# Patient Record
Sex: Female | Born: 1995 | Race: Black or African American | Hispanic: No | Marital: Single | Smoking: Current some day smoker
Health system: Southern US, Community
[De-identification: ages and names within clinical notes are randomized; demographics above are authoritative.]

## PROBLEM LIST (undated history)

## (undated) ENCOUNTER — Emergency Department: Admission: EM | Payer: Medicaid Other

## (undated) DIAGNOSIS — Z8759 Personal history of other complications of pregnancy, childbirth and the puerperium: Secondary | ICD-10-CM

## (undated) DIAGNOSIS — O020 Blighted ovum and nonhydatidiform mole: Secondary | ICD-10-CM

## (undated) DIAGNOSIS — F988 Other specified behavioral and emotional disorders with onset usually occurring in childhood and adolescence: Secondary | ICD-10-CM

## (undated) HISTORY — PX: NO PAST SURGERIES: SHX2092

---
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
## 2016-08-07 ENCOUNTER — Ambulatory Visit
Admission: EM | Admit: 2016-08-07 | Discharge: 2016-08-07 | Disposition: A | Discharge status: Home / Self Care | Payer: Medicaid Other | Attending: Emergency Medicine | Admitting: Emergency Medicine

## 2016-08-07 DIAGNOSIS — A749 Chlamydial infection, unspecified: Secondary | ICD-10-CM | POA: Insufficient documentation

## 2016-08-07 DIAGNOSIS — N760 Acute vaginitis: Secondary | ICD-10-CM | POA: Diagnosis not present

## 2016-08-07 DIAGNOSIS — B9689 Other specified bacterial agents as the cause of diseases classified elsewhere: Secondary | ICD-10-CM | POA: Diagnosis not present

## 2016-08-07 DIAGNOSIS — N390 Urinary tract infection, site not specified: Secondary | ICD-10-CM | POA: Diagnosis not present

## 2016-08-07 DIAGNOSIS — N898 Other specified noninflammatory disorders of vagina: Secondary | ICD-10-CM | POA: Diagnosis present

## 2016-08-07 DIAGNOSIS — Z202 Contact with and (suspected) exposure to infections with a predominantly sexual mode of transmission: Secondary | ICD-10-CM | POA: Diagnosis not present

## 2016-08-07 DIAGNOSIS — N399 Disorder of urinary system, unspecified: Secondary | ICD-10-CM | POA: Diagnosis present

## 2016-08-07 LAB — CHLAMYDIA/NGC RT PCR (ARMC ONLY)
CHLAMYDIA TR: DETECTED — AB
N GONORRHOEAE: NOT DETECTED

## 2016-08-07 LAB — URINALYSIS, COMPLETE (UACMP) WITH MICROSCOPIC
BILIRUBIN URINE: NEGATIVE
GLUCOSE, UA: NEGATIVE mg/dL
HGB URINE DIPSTICK: NEGATIVE
Ketones, ur: NEGATIVE mg/dL
NITRITE: POSITIVE — AB
PH: 7 (ref 5.0–8.0)
Protein, ur: NEGATIVE mg/dL
SPECIFIC GRAVITY, URINE: 1.025 (ref 1.005–1.030)

## 2016-08-07 LAB — PREGNANCY, URINE: PREG TEST UR: NEGATIVE

## 2016-08-07 LAB — WET PREP, GENITAL
SPERM: NONE SEEN
Trich, Wet Prep: NONE SEEN
Yeast Wet Prep HPF POC: NONE SEEN

## 2016-08-07 MED ORDER — AZITHROMYCIN 500 MG PO TABS
1000.0000 mg | ORAL_TABLET | Freq: Once | ORAL | Status: AC
  Administered 2016-08-07: 1000 mg via ORAL

## 2016-08-07 MED ORDER — CEFTRIAXONE SODIUM 250 MG IJ SOLR
250.0000 mg | Freq: Once | INTRAMUSCULAR | Status: AC
  Administered 2016-08-07: 250 mg via INTRAMUSCULAR

## 2016-08-07 MED ORDER — METRONIDAZOLE 500 MG PO TABS: 500.0000 mg | ORAL_TABLET | Freq: Two times a day (BID) | ORAL | 0 refills | Status: DC

## 2016-08-07 MED ORDER — NITROFURANTOIN MONOHYD MACRO 100 MG PO CAPS: 100.0000 mg | ORAL_CAPSULE | Freq: Two times a day (BID) | ORAL | 0 refills | Status: DC

## 2016-08-07 NOTE — ED Triage Notes (Signed)
Patient reports that boyfriend's mother called her yesterday and told her that he is positive for chlamydia. Patient states that she has noticed white, creamy discharge with a foul odor.

## 2016-08-07 NOTE — ED Provider Notes (Signed)
HPI  SUBJECTIVE:  Leslie Trevino is a 21 y.o. female who presents with 1 month of creamy odorous white vaginal discharge and odorous urine. She has tried an unknown over-the-counter pill for yeast, with no improvement in her symptoms. No aggravating factors. She states that these symptoms are identical to when she had chlamydia previously.   No dysuria, urgency, frequency, cloudy  urine, hematuria,  genital blisters, vaginal itching. No fevers, N/V, abd pain, back pain. No recent abx use. Pt sexually active with same female partner who was recently diagnosed with chlamydia. He has given it to her before. STD's a concern today. No h/o gonorrhea, HIV, HSV, syphilis, BV, Trichomonas. No history of PID, ectopic pregnancies, diabetes, hypertension. She has past medical history of chlamydia and yeast infections. LMP: No December. PMD: [facility].     No past medical history on file.  No past surgical history on file.  No family history on file.  Social History  Substance Use Topics  . Smoking status: Not on file  . Smokeless tobacco: Not on file  . Alcohol use Not on file    No current facility-administered medications for this encounter.   Current Outpatient Prescriptions:  .  metroNIDAZOLE (FLAGYL) 500 MG tablet, Take 1 tablet (500 mg total) by mouth 2 (two) times daily. X 7 days, Disp: 14 tablet, Rfl: 0 .  nitrofurantoin, macrocrystal-monohydrate, (MACROBID) 100 MG capsule, Take 1 capsule (100 mg total) by mouth 2 (two) times daily. X 5 days, Disp: 10 capsule, Rfl: 0  No Known Allergies   ROS  As noted in HPI.   Physical Exam  BP 108/70 (BP Location: Left Arm)   Pulse 77   Temp 98.3 F (36.8 C) (Oral)   Resp 18   Ht 5' 5.5" (1.664 m)   Wt 179 lb (81.2 kg)   LMP 07/06/2016   SpO2 100%   BMI 29.33 kg/m   Constitutional: Well developed, well nourished, no acute distress Eyes:  EOMI, conjunctiva normal bilaterally HENT: Normocephalic, atraumatic,mucus membranes  moist Respiratory: Normal inspiratory effort Cardiovascular: Normal rate GI: nondistended soft, nontender. No suprapubic tenderness  back: No CVA tenderness GU: External genitalia normal.  Normal vaginal mucosa.  Normal os. Thin oderous  white vaginal discharge.  Uterus smooth, NT. No CMT. No adnexal tenderness. No adnexal masses.  Chaperone present during exam skin: No rash, skin intact Musculoskeletal: no deformities Neurologic: Alert & oriented x 3, no focal neuro deficits Psychiatric: Speech and behavior appropriate   ED Course   Medications  cefTRIAXone (ROCEPHIN) injection 250 mg (250 mg Intramuscular Given 08/07/16 2021)  azithromycin (ZITHROMAX) tablet 1,000 mg (1,000 mg Oral Given 08/07/16 2020)    Orders Placed This Encounter  Procedures  . Wet prep, genital    Standing Status:   Standing    Number of Occurrences:   1    Order Specific Question:   Patient immune status    Answer:   Normal  . Chlamydia/NGC rt PCR    Standing Status:   Standing    Number of Occurrences:   1    Order Specific Question:   Patient immune status    Answer:   Normal  . Urinalysis, Complete w Microscopic    Standing Status:   Standing    Number of Occurrences:   1  . Pregnancy, urine    Standing Status:   Standing    Number of Occurrences:   1  . RPR    Standing Status:   Standing  Number of Occurrences:   1  . HIV antibody    Standing Status:   Standing    Number of Occurrences:   1    Results for orders placed or performed during the hospital encounter of 08/07/16 (from the past 24 hour(s))  Urinalysis, Complete w Microscopic     Status: Abnormal   Collection Time: 08/07/16  7:53 PM  Result Value Ref Range   Color, Urine YELLOW YELLOW   APPearance CLOUDY (A) CLEAR   Specific Gravity, Urine 1.025 1.005 - 1.030   pH 7.0 5.0 - 8.0   Glucose, UA NEGATIVE NEGATIVE mg/dL   Hgb urine dipstick NEGATIVE NEGATIVE   Bilirubin Urine NEGATIVE NEGATIVE   Ketones, ur NEGATIVE NEGATIVE  mg/dL   Protein, ur NEGATIVE NEGATIVE mg/dL   Nitrite POSITIVE (A) NEGATIVE   Leukocytes, UA SMALL (A) NEGATIVE   Squamous Epithelial / LPF 0-5 (A) NONE SEEN   WBC, UA 6-30 0 - 5 WBC/hpf   RBC / HPF 0-5 0 - 5 RBC/hpf   Bacteria, UA MANY (A) NONE SEEN  Pregnancy, urine     Status: None   Collection Time: 08/07/16  7:53 PM  Result Value Ref Range   Preg Test, Ur NEGATIVE NEGATIVE  Wet prep, genital     Status: Abnormal   Collection Time: 08/07/16  8:13 PM  Result Value Ref Range   Yeast Wet Prep HPF POC NONE SEEN NONE SEEN   Trich, Wet Prep NONE SEEN NONE SEEN   Clue Cells Wet Prep HPF POC PRESENT (A) NONE SEEN   WBC, Wet Prep HPF POC FEW (A) NONE SEEN   Sperm NONE SEEN    No results found.  ED Clinical Impression  STD exposure  Bacterial vaginosis  Urinary tract infection without hematuria, site unspecified  ED Assessment/Plan  H&P most c/w chlamydia and given that her sexual partner has a known case of chlamydia. We'll treat empirically. Also sent off HIV and RPR in addition to the gonorrhea and chlamydia. Giving ceftriaxone 250 mg IM, azithro 1 gm po. she has BV. Home with Flagyl.  she also has odorous urine and a positive UA for UTI. Will treat this with Macrobid. Advised pt to refrain from sexual contact until she knows lab results, symptoms resolve, and partner(s) are treated if necessary. Pt provided working phone number. Follow-up with PMD as needed. Discussed labs, MDM, plan and followup with patient. Pt agrees with plan.   Meds ordered this encounter  Medications  . cefTRIAXone (ROCEPHIN) injection 250 mg  . azithromycin (ZITHROMAX) tablet 1,000 mg  . metroNIDAZOLE (FLAGYL) 500 MG tablet    Sig: Take 1 tablet (500 mg total) by mouth 2 (two) times daily. X 7 days    Dispense:  14 tablet    Refill:  0  . nitrofurantoin, macrocrystal-monohydrate, (MACROBID) 100 MG capsule    Sig: Take 1 capsule (100 mg total) by mouth 2 (two) times daily. X 5 days    Dispense:  10  capsule    Refill:  0    *This clinic note was created using Scientist, clinical (histocompatibility and immunogenetics). Therefore, there may be occasional mistakes despite careful proofreading.  ?    Domenick Gong, MD 08/07/16 2105

## 2016-08-07 NOTE — Discharge Instructions (Signed)
Take the medication as written. Give us a working phone number so that we can contact you if needed. Refrain from sexual contact until you know your results and your partner(s) are treated.  Go to www.goodrx.com to look up your medications. This will give you a list of where you can find your prescriptions at the most affordable prices.

## 2016-08-11 LAB — HIV ANTIBODY (ROUTINE TESTING W REFLEX): HIV SCREEN 4TH GENERATION: NONREACTIVE

## 2016-08-11 LAB — RPR: RPR: NONREACTIVE

## 2016-10-14 DIAGNOSIS — E0590 Thyrotoxicosis, unspecified without thyrotoxic crisis or storm: Secondary | ICD-10-CM | POA: Insufficient documentation

## 2017-12-16 ENCOUNTER — Ambulatory Visit
Admission: EM | Admit: 2017-12-16 | Discharge: 2017-12-16 | Disposition: A | Discharge status: Home / Self Care | Payer: Medicaid Other | Attending: Family Medicine | Admitting: Family Medicine

## 2017-12-16 ENCOUNTER — Encounter: Payer: Self-pay | Admitting: Emergency Medicine

## 2017-12-16 ENCOUNTER — Other Ambulatory Visit: Payer: Self-pay

## 2017-12-16 DIAGNOSIS — L089 Local infection of the skin and subcutaneous tissue, unspecified: Secondary | ICD-10-CM | POA: Diagnosis not present

## 2017-12-16 DIAGNOSIS — H60392 Other infective otitis externa, left ear: Secondary | ICD-10-CM

## 2017-12-16 DIAGNOSIS — H60502 Unspecified acute noninfective otitis externa, left ear: Secondary | ICD-10-CM | POA: Diagnosis not present

## 2017-12-16 HISTORY — DX: Blighted ovum and nonhydatidiform mole: O02.0

## 2017-12-16 MED ORDER — OFLOXACIN 0.3 % OT SOLN: 10.0000 [drp] | Freq: Every day | OTIC | 0 refills | Status: AC

## 2017-12-16 MED ORDER — AMOXICILLIN-POT CLAVULANATE 875-125 MG PO TABS: 1.0000 | ORAL_TABLET | Freq: Two times a day (BID) | ORAL | 0 refills | Status: DC

## 2017-12-16 NOTE — ED Triage Notes (Signed)
Patient c/o boil to left inner ear, woke up this morning with ear pain and bloody drainage.

## 2017-12-16 NOTE — Discharge Instructions (Signed)
Take medication as prescribed. Rest. Drink plenty of fluids.  ° °Follow up with your primary care physician this week as needed. Return to Urgent care for new or worsening concerns.  ° °

## 2017-12-16 NOTE — ED Provider Notes (Signed)
[facility] ____________________________________________  Time seen: Approximately 9:25 AM  I have reviewed the triage vital signs and the nursing notes.   HISTORY  Chief Complaint Otalgia   HPI Leslie Trevino is a 22 y.o. female presented for evaluation of left ear complaints.  Patient reports of the last 1 week she had a bump inside her ear that turned into more of a "boil ", states that she has been cleaning with alcohol and a few days ago the area drained.  States over the last 2 days she has had internal ear discomfort and muffled hearing.  Denies any trauma.  Denies history of MRSA or previous skin infections.  No accompanying runny nose, nasal congestion or other complaints.  Reports otherwise feels well.  No other alleviating measures attempted.  Denies recent sickness. Denies recent antibiotic use.   Patient's last menstrual period was 12/13/2017 (exact date).Denies pregnancy.    Past Medical History:  Diagnosis Date  . Molar pregnancy     There are no active problems to display for this patient.   History reviewed. No pertinent surgical history.   No current facility-administered medications for this encounter.   Current Outpatient Medications:  .  ferrous sulfate 325 (65 FE) MG tablet, Take 325 mg by mouth daily with breakfast., Disp: , Rfl:  .  amoxicillin-clavulanate (AUGMENTIN) 875-125 MG tablet, Take 1 tablet by mouth every 12 (twelve) hours., Disp: 20 tablet, Rfl: 0 .  ofloxacin (FLOXIN) 0.3 % OTIC solution, Place 10 drops into the left ear daily for 7 days., Disp: 5 mL, Rfl: 0  Allergies Patient has no known allergies.  No family history on file.  Social History Social History   Tobacco Use  . Smoking status: Current Some Day Smoker    Packs/day: 0.25    Types: Cigarettes  . Smokeless tobacco: Never Used  Substance Use Topics  . Alcohol use: Never    Frequency: Never  . Drug use: Never    Review of Systems Constitutional: No  fever ENT: No sore throat. Cardiovascular: Denies chest pain. Respiratory: Denies shortness of breath. Gastrointestinal: No abdominal pain.   Musculoskeletal: Negative for back pain. Skin: Negative for rash.   ____________________________________________   PHYSICAL EXAM:  VITAL SIGNS: ED Triage Vitals  Enc Vitals Group     BP 12/16/17 0849 108/77     Pulse Rate 12/16/17 0849 61     Resp 12/16/17 0849 16     Temp 12/16/17 0849 98 F (36.7 C)     Temp Source 12/16/17 0849 Oral     SpO2 12/16/17 0849 100 %     Weight 12/16/17 0843 150 lb (68 kg)     Height 12/16/17 0843  (1.651 m)     Head Circumference --      Peak Flow --      Pain Score 12/16/17 0842 7     Pain Loc --      Pain Edu? --      Excl. in GC? --    Constitutional: Alert and oriented. Well appearing and in no acute distress. Eyes: Conjunctivae are normal.  Head: Atraumatic. No sinus tenderness to palpation. No swelling. No erythema.  Ears: Right: Nontender, normal canal, no erythema, normal TM.  Left tenderness with oral auricle movement, at external canal at concha crusting area with mild erythema, canal with mild edema and erythema and active drainage, able to fully visualize TM with only mild erythema present, TM appears otherwise normal.  No mastoid tenderness bilaterally.  Nose:No nasal congestion   Mouth/Throat: Mucous membranes are moist. No pharyngeal erythema. No tonsillar swelling or exudate.  Neck: No stridor.  No cervical spine tenderness to palpation. Hematological/Lymphatic/Immunilogical: No cervical lymphadenopathy. Cardiovascular: Normal rate, regular rhythm. Grossly normal heart sounds.  Good peripheral circulation. Respiratory: Normal respiratory effort.  No retractions. No wheezes, rales or rhonchi. Good air movement.  Musculoskeletal: Ambulatory with steady gait.  Neurologic:  Normal speech and language. No gait instability. Skin:  Skin appears warm, dry. AS above.  Psychiatric: Mood  and affect are normal. Speech and behavior are normal.  ___________________________________________   LABS (all labs ordered are listed, but only abnormal results are displayed)  Labs Reviewed - No data to display   PROCEDURES Procedures    INITIAL IMPRESSION / ASSESSMENT AND PLAN / ED COURSE  Pertinent labs & imaging results that were available during my care of the patient were reviewed by me and considered in my medical decision making (see chart for details).  Well-appearing patient.  No acute distress.  Suspect patient had small abscess present to the external ear that has since drained and improving, but appearance of cellulitis remains, also with external otitis ear otherwise appears normal.  Will treat with oral Augmentin and ofloxacin.  Encourage keeping clean and supportive care.Discussed indication, risks and benefits of medications with patient.  Discussed follow up with Primary care physician this week. Discussed follow up and return parameters including no resolution or any worsening concerns. Patient verbalized understanding and agreed to plan.   ____________________________________________   FINAL CLINICAL IMPRESSION(S) / ED DIAGNOSES  Final diagnoses:  Acute otitis externa of left ear, unspecified type  Skin of left earlobe with infection     ED Discharge Orders        Ordered    ofloxacin (FLOXIN) 0.3 % OTIC solution  Daily     12/16/17 0927    amoxicillin-clavulanate (AUGMENTIN) 875-125 MG tablet  Every 12 hours     12/16/17 3086       Note: This dictation was prepared with Dragon dictation along with smaller phrase technology. Any transcriptional errors that result from this process are unintentional.         Renford Dills, NP 12/16/17 1225

## 2019-05-10 ENCOUNTER — Emergency Department
Admission: EM | Admit: 2019-05-10 | Discharge: 2019-05-10 | Disposition: A | Discharge status: Home / Self Care | Payer: Medicaid Other | Attending: Student in an Organized Health Care Education/Training Program | Admitting: Student in an Organized Health Care Education/Training Program

## 2019-05-10 ENCOUNTER — Encounter: Payer: Self-pay | Admitting: Emergency Medicine

## 2019-05-10 ENCOUNTER — Other Ambulatory Visit: Payer: Self-pay

## 2019-05-10 DIAGNOSIS — Z79899 Other long term (current) drug therapy: Secondary | ICD-10-CM | POA: Diagnosis not present

## 2019-05-10 DIAGNOSIS — H6123 Impacted cerumen, bilateral: Secondary | ICD-10-CM | POA: Insufficient documentation

## 2019-05-10 DIAGNOSIS — H9203 Otalgia, bilateral: Secondary | ICD-10-CM | POA: Diagnosis present

## 2019-05-10 DIAGNOSIS — F17210 Nicotine dependence, cigarettes, uncomplicated: Secondary | ICD-10-CM | POA: Diagnosis not present

## 2019-05-10 NOTE — ED Provider Notes (Signed)
[facility] Emergency Department Provider Note ____________________________________________  Time seen: 1759  I have reviewed the triage vital signs and the nursing notes.  HISTORY  Chief Complaint  Dental Pain and Otalgia  HPI Leslie Trevino is a 23 y.o. female presents to the ED with complaints of right greater than left ear pain.  Patient is also had some intermittent right-sided jaw pain rated to dental cavity.  She reports symptoms have been intermittent for the last 2 weeks.   She denies any interim fever, chills, or sweats patient also had no difficulty controlling secretions, eating, drinking.  Patient is expected to see a dental provider in 2 days for definitive management of her right lower cavity.  She denies any other complaints including sore throat, hearing loss, vertigo, or tinnitus.  Past Medical History:  Diagnosis Date  . Molar pregnancy     There are no active problems to display for this patient.   History reviewed. No pertinent surgical history.  Prior to Admission medications   Medication Sig Start Date End Date Taking? Authorizing Provider  ferrous sulfate 325 (65 FE) MG tablet Take 325 mg by mouth daily with breakfast.    [provider]    Allergies Patient has no known allergies.  History reviewed. No pertinent family history.  Social History Social History   Tobacco Use  . Smoking status: Current Some Day Smoker    Packs/day: 0.25    Types: Cigarettes  . Smokeless tobacco: Never Used  Substance Use Topics  . Alcohol use: Never    Frequency: Never  . Drug use: Never    Review of Systems  Constitutional: Negative for fever. Eyes: Negative for visual changes. ENT: Negative for sore throat.  Otalgia as above.  Right dental pain as above. Cardiovascular: Negative for chest pain. Respiratory: Negative for shortness of breath. Musculoskeletal: Negative for back pain. Skin: Negative for rash. Neurological:  Negative for headaches, focal weakness or numbness. ____________________________________________  PHYSICAL EXAM:  VITAL SIGNS: ED Triage Vitals  Enc Vitals Group     BP 05/10/19 1649 107/64     Pulse Rate 05/10/19 1649 71     Resp 05/10/19 1649 15     Temp 05/10/19 1649 98.8 F (37.1 C)     Temp Source 05/10/19 1649 Oral     SpO2 05/10/19 1649 100 %     Weight 05/10/19 1651 162 lb (73.5 kg)     Height 05/10/19 1651 5\' 5"  (1.651 m)     Head Circumference --      Peak Flow --      Pain Score 05/10/19 1651 10     Pain Loc --      Pain Edu? --      Excl. in GC? --     Constitutional: Alert and oriented. Well appearing and in no distress. Head: Normocephalic and atraumatic. Eyes: Conjunctivae are normal. PERRL. Normal extraocular movements Ears: Canals obscured by soft brown wax bilaterally. TMs intact bilaterally. Nose: No congestion/rhinorrhea/epistaxis. Mouth/Throat: Mucous membranes are moist.  No oral lesions noted. Neck: Supple. No thyromegaly. Hematological/Lymphatic/Immunological: No cervical lymphadenopathy. Cardiovascular: Normal rate, regular rhythm. Normal distal pulses. Respiratory: Normal respiratory effort. No wheezes/rales/rhonchi. Musculoskeletal: Nontender with normal range of motion in all extremities.  Neurologic:  Normal gait without ataxia. Normal speech and language. No gross focal neurologic deficits are appreciated. Skin:  Skin is warm, dry and intact. No rash noted. ____________________________________________  PROCEDURES  .Ear Cerumen Removal  Date/Time: 05/10/2019 6:32 PM Performed by: 05/12/2019,  Dannielle Karvonen, PA-C Authorized by: Melvenia Needles, PA-C   Consent:    Consent obtained:  Verbal   Consent given by:  Patient   Risks discussed:  Pain and incomplete removal   Alternatives discussed:  Alternative treatment Procedure details:    Location:  L ear and R ear   Procedure type: irrigation   Post-procedure details:     Inspection:  TM intact   Hearing quality:  Improved   Patient tolerance of procedure:  Tolerated well, no immediate complications   ____________________________________________  INITIAL IMPRESSION / ASSESSMENT AND PLAN / ED COURSE  Patient with ED evaluation of bilateral otalgia right slightly greater than left, who has clinical picture consistent with a cerumen impaction.  Ears were washed using a 50% mixture of water and hydrogen peroxide.  Patient tolerated procedure well and TMs are visible and intact.  Patient reports improvement of her symptoms overall.  She will follow-up with her dental provider as planned patient also encouraged to use over-the-counter earwax removal solution to prevent impaction in the future.  Leslie Trevino was evaluated in Emergency Department on 05/10/2019 for the symptoms described in the history of present illness. She was evaluated in the context of the global COVID-19 pandemic, which necessitated consideration that the patient might be at risk for infection with the SARS-CoV-2 virus that causes COVID-19. Institutional protocols and algorithms that pertain to the evaluation of patients at risk for COVID-19 are in a state of rapid change based on information released by regulatory bodies including the CDC and federal and state organizations. These policies and algorithms were followed during the patient's care in the ED. ____________________________________________  FINAL CLINICAL IMPRESSION(S) / ED DIAGNOSES  Final diagnoses:  Bilateral impacted cerumen      Melvenia Needles, PA-C 05/10/19 1943    Merlyn Lot, MD 05/10/19 2121

## 2019-05-10 NOTE — Discharge Instructions (Addendum)
We have successfully cleared your ears of a small build-up of wax. Use OTC Debrox or Murine ear drop solution to keep canals clear of water. Follow-up with your provider for ongoing symptoms.

## 2019-05-10 NOTE — ED Triage Notes (Signed)
Pt to ED via POV c/o right ear pain and right sided dental pain. Pt states that this has been going on x 2 weeks. Pt is in NAD.

## 2019-05-10 NOTE — ED Notes (Signed)
Toothache started about a month ago on the bottom right side- no redness noted around any of the teeth- earache started about 2 weeks ago- states it has gotten so bad that she has started having headaches as well

## 2019-07-24 NOTE — L&D Delivery Note (Signed)
Delivery Note  Date of delivery: 12/21/2019 Estimated Date of Delivery: 12/31/19 Patient's last menstrual period was 03/26/2019. EGA: [redacted]w[redacted]d  Delivery Note At 10:55 AM a viable female was delivered via Vaginal, Spontaneous (Presentation: Right Occiput Anterior).  APGAR: 5, 9; weight 8 lb 3.9 oz (3740 g).   Placenta status: Spontaneous, Intact.  Cord: 3 vessels with the following complications: None.    Anesthesia: Epidural Episiotomy: None Lacerations: 1st degree;Perineal Suture Repair: 3.0 vicryl Est. Blood Loss (mL): 300   First Stage: 3h44m Labor onset: 0747 Augmentation : AROM Analgesia /Anesthesia intrapartum: epidural AROM at 1034  Leslie Trevino presented to L&D with contractions. She was augmented with AROM. Epidural placed.   Second Stage: 0h 48m Complete dilation at 1048 Onset of pushing at 1048 FHR second stage Cat I Delivery at 1055 on 12/21/2019  She progressed to complete and had a spontaneous vaginal birth of a live female over an intact perineum. The fetal head was delivered in OA position with restitution to ROA. Nuchal cord x 1 noted - summersault maneuver used sucessfully. Anterior then posterior shoulders delivered spontaneously. Baby placed on mom's abdomen and attended to by transition RN. Cord clamped and cut after 30 sec by FOB.  Baby taken to warmer for evaluation.  Cord blood obtained for newborn labs.  Third Stage: 0h 83m Placenta delivered intact with 3VC at 1104 Placenta disposition: routine disposal Uterine tone firm / bleeding min IV pitocin given for hemorrhage prophylaxis  1st degree laceration identified  Anesthesia for repair: epidural Repair 3.0 vicryl Est. Blood Loss (mL): 375  Complications: Shoulder dystocia x 30 sec - used McRoberts, Thick MSAF, nuchal cord, APGARs 5,9  Mom to postpartum.  Baby to Couplet care / Skin to Skin.  Newborn: Birth Weight: 3740g 8#3.9  Apgar Scores: 5, 9 Feeding planned: formula   Leslie Trevino,  CNM 12/21/2019 12:23 PM

## 2019-08-11 ENCOUNTER — Ambulatory Visit
Admission: EM | Admit: 2019-08-11 | Discharge: 2019-08-11 | Disposition: A | Discharge status: Home / Self Care | Payer: Medicaid Other | Attending: Family Medicine | Admitting: Family Medicine

## 2019-08-11 ENCOUNTER — Encounter: Payer: Self-pay | Admitting: Emergency Medicine

## 2019-08-11 ENCOUNTER — Other Ambulatory Visit: Payer: Self-pay

## 2019-08-11 DIAGNOSIS — Z20822 Contact with and (suspected) exposure to covid-19: Secondary | ICD-10-CM | POA: Diagnosis present

## 2019-08-11 DIAGNOSIS — Z7189 Other specified counseling: Secondary | ICD-10-CM | POA: Insufficient documentation

## 2019-08-11 NOTE — Discharge Instructions (Addendum)
It was very nice seeing you today in clinic. Thank you for entrusting me with your care.   You were tested for SARS-CoV-2 (novel coronavirus) today. Testing is performed by an outside lab ([facility]) and has variable turn around times ranging between 24-48 hours. Current recommendations from the the CDC and [place] DHHS require that you remain out of work in order to quarantine at home until negative test results are have been received. In the event that your test results are positive, you will be contacted with further directives. These measures are being implemented out of an abundance of caution to prevent transmission and spread during the current SARS-CoV-2 pandemic.  If you develop any worsening symptoms or concerns, make arrangements to follow up with your regular doctor. If your symptoms are severe, please seek follow up care in the ER. Please remember, our [facility] providers are "right here with you" when you need us.   Again, it was my pleasure to take care of you today. Thank you for choosing our clinic. I hope that you start to feel better quickly.   Malanie Koloski, MSN, APRN, FNP-C, CEN Advanced Practice Provider [facility] 

## 2019-08-11 NOTE — ED Triage Notes (Signed)
Patient here for COVID testing. No symptoms. Denies exposure.

## 2019-08-12 LAB — NOVEL CORONAVIRUS, NAA (HOSP ORDER, SEND-OUT TO REF LAB; TAT 18-24 HRS): SARS-CoV-2, NAA: NOT DETECTED

## 2019-08-12 NOTE — ED Provider Notes (Signed)
[place], [place]   Name: Leslie Trevino DOB: August 08, 1995 MRN: [id number] CSN: [id number] PCP: [facility] [place] date and time:  08/11/19 1727  Chief Complaint:  COVID testing   NOTE: Prior to seeing the patient today, I have reviewed the triage nursing documentation and vital signs. Clinical staff has updated patient's PMH/PSHx, current medication list, and drug allergies/intolerances to ensure comprehensive history available to assist in medical decision making.   History:   HPI: Leslie Trevino is a 24 y.o. female who presents today with requests for SARS-CoV-2 (novel coronavirus) testing. There has been no exposure, however daughter, who is also being seen in clinic today, has a cough. Patient presents today with no symptoms; no cough, fevers, or other symptoms commonly associated with SARS-CoV-2. She advises that she feels generally well. Patient presents for testing out of concern for her personal health. She adds that she is is being required to provide documentation of negative test results before she will be allowed to return to work.   Past Medical History:  Diagnosis Date  . Molar pregnancy     History reviewed. No pertinent surgical history.  History reviewed. No pertinent family history.  Social History   Tobacco Use  . Smoking status: Former Smoker    Packs/day: 0.25    Types: Cigarettes  . Smokeless tobacco: Never Used  Substance Use Topics  . Alcohol use: Never  . Drug use: Never    There are no problems to display for this patient.   Home Medications:    No outpatient medications have been marked as taking for the 08/11/19 encounter [facility] Encounter).    Allergies:   Patient has no known allergies.  Review of Systems (ROS): Review of Systems  Constitutional: Negative for fatigue and fever.  HENT: Negative for congestion, ear pain, postnasal drip, rhinorrhea, sinus pressure, sinus pain, sneezing and sore throat.   Eyes:  Negative for pain, discharge and redness.  Respiratory: Negative for cough, chest tightness and shortness of breath.   Cardiovascular: Negative for chest pain and palpitations.  Gastrointestinal: Negative for abdominal pain, diarrhea, nausea and vomiting.  Musculoskeletal: Negative for arthralgias, back pain, myalgias and neck pain.  Skin: Negative for color change, pallor and rash.  Neurological: Negative for dizziness, syncope, weakness and headaches.  Hematological: Negative for adenopathy.   Vital Signs: Today's Vitals   08/11/19 1751 08/11/19 1753 08/11/19 1817  BP:  99/68   Pulse:  69   Resp:  18   Temp:  98.4 F (36.9 C)   TempSrc:  Oral   SpO2:  98%   Weight: 186 lb (84.4 kg)    Height: 5\' 5"  (1.651 m)    PainSc: 0-No pain  0-No pain    Physical Exam: Physical Exam  Constitutional: She is oriented to person, place, and time and well-developed, well-nourished, and in no distress.  HENT:  Head: Normocephalic and atraumatic.  Eyes: Pupils are equal, round, and reactive to light.  Cardiovascular: Normal rate, regular rhythm, normal heart sounds and intact distal pulses.  Pulmonary/Chest: Effort normal and breath sounds normal.  Neurological: She is alert and oriented to person, place, and time. Gait normal.  Skin: Skin is warm and dry. No rash noted. She is not diaphoretic.  Psychiatric: Mood, memory, affect and judgment normal.  Nursing note and vitals reviewed.  Urgent Care Treatments / Results:   Orders Placed This Encounter  Procedures  . Novel Coronavirus, NAA (Hosp order, Send-out to Ref Lab; TAT 18-24 hrs  LABS: PLEASE NOTE: all labs that were ordered this encounter are listed, however only abnormal results are displayed. Labs Reviewed  NOVEL CORONAVIRUS, NAA (HOSP ORDER, SEND-OUT TO REF LAB; TAT 18-24 HRS)    EKG: -None  RADIOLOGY: No results found.  PROCEDURES: Procedures  MEDICATIONS RECEIVED THIS VISIT: Medications - No data to  display  PERTINENT CLINICAL COURSE NOTES/UPDATES:   Initial Impression / Assessment and Plan / Urgent Care Course:  Pertinent labs & imaging results that were available during my care of the patient were personally reviewed by me and considered in my medical decision making (see lab/imaging section of note for values and interpretations).  Leslie Trevino is a 24 y.o. female who presents to [facility] today requests for COVID testing   Patient overall well appearing and in no acute distress today in clinic. Presenting symptoms (see HPI) and exam as documented above. She presents with concerns for her personal health after daughter recently developed a cough. Discussed typical symptom constellation and patient reaffirms that she has none of the commonly associated symptoms seen with SARS-CoV-2 infection. Reviewed potential for infection with her daughter having symptoms. Given exposure and potential for infection, testing is reasonable. SARS-CoV-2 swab collected by certified clinical staff. Discussed variable turn around times associated with testing, as swabs are being processed at [facility], and have been between 24-48 hours to come back. She was advised to self quarantine, per [facility] DHHS guidelines, until negative results received.   Current clinical condition warrants patient being out of work in order to quarantine while waiting for testing results. She was provided with the appropriate documentation to provide to her place of employment that will allow for her to RTW on 08/14/2019 with no restrictions. RTW is contingent on her SARS-CoV-2 test results being reviewed as negative.    Discussed follow up with primary care physician should she develop any concerning symptoms. I have reviewed the follow up and strict return precautions for any new or worsening symptoms. Patient is aware of symptoms that would be deemed urgent/emergent, and would thus require further evaluation either here or in the  emergency department. At the time of discharge, he verbalized understanding and consent with the discharge plan as it was reviewed with him. All questions were fielded by provider and/or clinic staff prior to patient discharge.     Final Clinical Impressions / Urgent Care Diagnoses:   Final diagnoses:  Encounter for laboratory testing for COVID-19 virus  Advice given about COVID-19 virus infection    New Prescriptions:  [place] Controlled Substance Registry consulted? Not Applicable  No orders of the defined types were placed in this encounter.   Recommended Follow up Care:  Patient encouraged to follow up with the following provider within the specified time frame, or sooner as dictated by the severity of her symptoms. As always, she was instructed that for any urgent/emergent care needs, she should seek care either here or in the emergency department for more immediate evaluation.  Follow-up Information    [facility].   Specialty: General Practice Why: As needed Contact information: Physical Therapy [address] [place] [address] [phone]         NOTE: This note was prepared using Dragon dictation software along with smaller phrase technology. Despite my best ability to proofread, there is the potential that transcriptional errors may still occur from this process, and are completely unintentional.    Verlee Monte, NP 08/12/19 1502

## 2019-09-15 ENCOUNTER — Ambulatory Visit
Admission: EM | Admit: 2019-09-15 | Discharge: 2019-09-15 | Disposition: A | Discharge status: Home / Self Care | Payer: Medicaid Other | Attending: Urgent Care | Admitting: Urgent Care

## 2019-09-15 ENCOUNTER — Other Ambulatory Visit: Payer: Self-pay

## 2019-09-15 DIAGNOSIS — Z3490 Encounter for supervision of normal pregnancy, unspecified, unspecified trimester: Secondary | ICD-10-CM

## 2019-09-15 DIAGNOSIS — E162 Hypoglycemia, unspecified: Secondary | ICD-10-CM | POA: Diagnosis present

## 2019-09-15 DIAGNOSIS — E876 Hypokalemia: Secondary | ICD-10-CM | POA: Diagnosis present

## 2019-09-15 HISTORY — DX: Personal history of other complications of pregnancy, childbirth and the puerperium: Z87.59

## 2019-09-15 LAB — BASIC METABOLIC PANEL
Anion gap: 9 (ref 5–15)
BUN: 5 mg/dL — ABNORMAL LOW (ref 6–20)
CO2: 22 mmol/L (ref 22–32)
Calcium: 9.1 mg/dL (ref 8.9–10.3)
Chloride: 104 mmol/L (ref 98–111)
Creatinine, Ser: 0.43 mg/dL — ABNORMAL LOW (ref 0.44–1.00)
GFR calc Af Amer: >60 mL/min (ref >60)
GFR calc non Af Amer: >60 mL/min (ref >60)
Glucose, Bld: 64 mg/dL — ABNORMAL LOW (ref 70–99)
Potassium: 3 mmol/L — ABNORMAL LOW (ref 3.5–5.1)
Sodium: 135 mmol/L (ref 135–145)

## 2019-09-15 LAB — HCG, QUANTITATIVE, PREGNANCY: hCG, Beta Chain, Quant, S: 93807 m[IU]/mL — ABNORMAL HIGH (ref <5)

## 2019-09-15 LAB — CBC
HCT: 33.9 % — ABNORMAL LOW (ref 36.0–46.0)
Hemoglobin: 11.7 g/dL — ABNORMAL LOW (ref 12.0–15.0)
MCH: 30.6 pg (ref 26.0–34.0)
MCHC: 34.5 g/dL (ref 30.0–36.0)
MCV: 88.7 fL (ref 80.0–100.0)
Platelets: 198 10*3/uL (ref 150–400)
RBC: 3.82 MIL/uL — ABNORMAL LOW (ref 3.87–5.11)
RDW: 13.2 % (ref 11.5–15.5)
WBC: 7.3 10*3/uL (ref 4.0–10.5)
nRBC: 0 % (ref 0.0–0.2)

## 2019-09-15 LAB — PREGNANCY, URINE: Preg Test, Ur: POSITIVE — AB

## 2019-09-15 MED ORDER — POTASSIUM CHLORIDE ER 20 MEQ PO TBCR: 20.0000 meq | EXTENDED_RELEASE_TABLET | Freq: Every day | ORAL | 0 refills | Status: DC

## 2019-09-15 NOTE — Discharge Instructions (Addendum)
It was very nice seeing you today in clinic. Thank you for entrusting me with your care.   Your potassium was low. Use the potassium that I gave you ONCE daily for the next 3 days. Blood and urine today indicate that you are indeed pregnant. You will require further labs and imaging to determine whether this is a viable pregnancy. If you develop any bleeding or pain, proceed directly to the emergency room.   Make arrangements to follow up with Dr. Vergie Living for re-evaluation of your blood results. If your symptoms/condition worsens, please seek follow up care either here or in the ER. Please remember, our [facility] providers are "right here with you" when you need [place].   Again, it was my pleasure to take care of you today. Thank you for choosing our clinic. I hope that you start to feel better quickly.   Quentin Mulling, MSN, APRN, FNP-C, CEN Advanced Practice Provider [facility]

## 2019-09-15 NOTE — ED Triage Notes (Addendum)
Pt presents with c/o concern for possible pregnancy. 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 She was previously on DepoProvera and reports her last injection would have been in September. She has not had a menstrual cycle in quite some time. She did take a home pregnancy test last night and it was positive. She has been advised by her PCP that her hormone levels are often off-balance. She would like to have a pregnancy test completed, urine and blood. She also reports 2 week history of lower abdominal cramps.

## 2019-09-17 ENCOUNTER — Encounter: Payer: Self-pay | Admitting: Urgent Care

## 2019-09-17 NOTE — ED Provider Notes (Signed)
[place], [place]   Name: Leslie Trevino DOB: 07/06/1996 MRN: [id number] CSN: [id number] PCP: [facility] [place] date and time:  09/15/19 1249  Chief Complaint:  Possible Pregnancy   NOTE: Prior to seeing the patient today, I have reviewed the triage nursing documentation and vital signs. Clinical staff has updated patient's PMH/PSHx, current medication list, and drug allergies/intolerances to ensure comprehensive history available to assist in medical decision making.   History:   HPI: Leslie Trevino is a 24 y.o. female who presents today with request for lab testing to confirm pregnancy.  Reporting that she has taken several home pregnancy test and the results have been positive.  Patient is excited about the possibility of being pregnant, however she is questioning whether results are correct given the fact that she has had several miscarriages in the past due to hematological and hormonal issues ([id number]). Of note, patient previously on Depo-Provera; last injection was in September or October 2020.  She is unsure of her LMP citing that she has had irregular menstrual cycles in the past.  atient denies any nausea, vomiting, breast tenderness, or vaginal bleeding.  She does endorse a 2-week history of some minor lower abdominal cramping.  She denies any vertiginous symptoms.  She has not experienced any urinary symptoms; no dysuria, frequency, urgency, or gross hematuria.  Patient also with concerns about her potassium level citing that is been low in the past.  She takes daily OTC potassium supplementation.  Past Medical History:  Diagnosis Date  . History of miscarriage   . Molar pregnancy     History reviewed. No pertinent surgical history.  History reviewed. No pertinent family history.  Social History   Tobacco Use  . Smoking status: Former Smoker    Packs/day: 0.25    Types: Cigarettes  . Smokeless tobacco: Never Used  Substance Use Topics  . Alcohol  use: Not Currently  . Drug use: Never    There are no problems to display for this patient.   Home Medications:    Current Meds  Medication Sig  . b complex vitamins tablet Take 1 tablet by mouth daily.  . ferrous sulfate 325 (65 FE) MG tablet Take 325 mg by mouth daily with breakfast.  . Multiple Vitamin (MULTIVITAMIN WITH MINERALS) TABS tablet Take 1 tablet by mouth daily.  . Potassium 99 MG TABS Take by mouth.  . Vitamin D, Ergocalciferol, (DRISDOL) 1.25 MG (50000 UNIT) CAPS capsule Take 1 capsule by mouth daily.    Allergies:   Patient has no known allergies.  Review of Systems (ROS):  Review of systems NEGATIVE unless otherwise noted in narrative H&P section.   Vital Signs: Today's Vitals   09/15/19 1305 09/15/19 1309 09/15/19 1426  BP:  99/65   Pulse:  82   Resp:  16   Temp:  98.2 F (36.8 C)   TempSrc:  Oral   SpO2:  100%   Weight: 188 lb (85.3 kg)    Height: 5\' 5"  (1.651 m)    PainSc: 0-No pain  0-No pain    Physical Exam: Physical Exam  Constitutional: She is oriented to person, place, and time and well-developed, well-nourished, and in no distress.  HENT:  Head: Normocephalic and atraumatic.  Eyes: Pupils are equal, round, and reactive to light.  Cardiovascular: Normal rate, regular rhythm, normal heart sounds and intact distal pulses.  Pulmonary/Chest: Effort normal and breath sounds normal.  Abdominal: Soft. Normal appearance and bowel sounds are normal. There is  no abdominal tenderness. There is no CVA tenderness.  Genitourinary:    Genitourinary Comments: Exam deferred. No vaginal/pelvic pain or current bleeding.    Neurological: She is alert and oriented to person, place, and time. Gait normal.  Skin: Skin is warm and dry. No rash noted. She is not diaphoretic.  Psychiatric: Memory, affect and judgment normal. Her mood appears anxious.  Nursing note and vitals reviewed.   Urgent Care Treatments / Results:   Orders Placed This Encounter    Procedures  . Pregnancy, urine  . CBC  . hCG, quantitative, pregnancy  . Basic metabolic panel    LABS: PLEASE NOTE: all labs that were ordered this encounter are listed, however only abnormal results are displayed. Labs Reviewed  PREGNANCY, URINE - Abnormal; Notable for the following components:      Result Value   Preg Test, Ur POSITIVE (*)    All other components within normal limits  CBC - Abnormal; Notable for the following components:   RBC 3.82 (*)    Hemoglobin 11.7 (*)    HCT 33.9 (*)    All other components within normal limits  HCG, QUANTITATIVE, PREGNANCY - Abnormal; Notable for the following components:   hCG, Beta Chain, Quant, S 93,807 (*)    All other components within normal limits  BASIC METABOLIC PANEL - Abnormal; Notable for the following components:   Potassium 3.0 (*)    Glucose, Bld 64 (*)    BUN 5 (*)    Creatinine, Ser 0.43 (*)    All other components within normal limits    EKG: -None  RADIOLOGY: No results found.  PROCEDURES: Procedures  MEDICATIONS RECEIVED THIS VISIT: Medications - No data to display  PERTINENT CLINICAL COURSE NOTES/UPDATES:   Initial Impression / Assessment and Plan / Urgent Care Course:  Pertinent labs & imaging results that were available during my care of the patient were personally reviewed by me and considered in my medical decision making (see lab/imaging section of note for values and interpretations).  [name] is a 24 y.o. female who presents to [facility] today with complaints of Possible Pregnancy  Patient is well appearing overall in clinic today. She does not appear to be in any acute distress. Presenting symptoms (see HPI) and exam as documented above.  Patient presents today for lab testing to confirm pregnancy after have taken several home pregnancy test.  Patient has a past medical history significant for miscarriage, therefore patient is anxious about the results.  She is requesting  both urine and blood testing to confirm citing issues with hormone levels in the past.  Additionally, patient is asking for her blood counts and potassium to be checked.  Given her past medical history, request felt to be reasonable.  We will proceed as follows:   Urine hCG (+) for pregnancy   Beta hCG elevated at 93,807 mIU/mL   WBC 7.3.  Hemoglobin 11.7, hematocrit 33.9, MCV 88.7, MCH 30.6, and platelet 198,000.   Na 135, K+ 3.0, glucose 64, BUN 5, and creatinine 0.43 mg/dL. Estimated Creatinine Clearance: 116.9 mL/min (A) (by C-G formula based on SCr of 0.43 mg/dL (L)).  Discussed results with patient in detail.  Patient aware that both urine and blood testing positive for a pregnant state, however we discussed the fact that the viability of the fetus has not been determined.  We discussed that serial monitoring of her hCG levels, in addition to ultrasound imaging, will be required to determine both viability and gestational  age.  Due to irregular menstrual patterns and use of Depo-Provera, patient unable to advise when LMP was.  Based on beta hCG levels, patient currently is approximately [redacted] weeks pregnant.  Patient reports that she is scheduled to see her PCP later this week.  She was advised to make provider aware of current pregnancy (copy of labs provided) in order to ensure appropriate ongoing management and follow-up.  I stressed to the patient that while the labs are showing a current pregnancy, viability has not been confirmed at this point.  Regarding her potassium.  Despite daily over-the-counter supplementation (99 mg), patient continues to be hypokalemic at 3.0 mmol/L.  Patient advised to discontinue OTC supplementation and start KDur 20 mEq daily x3 days.  Patient advised to follow-up with her primary care physician as already planned to have her levels rechecked and to discuss daily prescription strength management of her chronic hypokalemia.  With routine labs today, patient found  to be hypoglycemic at 64 mg/dL.  Patient advising that she has not eaten today.  She was provided with sugar containing fluids in clinic and encouraged to eat something as soon as possible.  Patient notes that she is leaving clinic and stopping by a restaurant to get something to eat today.  I have reviewed the follow up and strict return precautions for any new or worsening symptoms. Patient is aware of symptoms that would be deemed urgent/emergent, and would thus require further evaluation either here or in the emergency department. At the time of discharge, she verbalized understanding and consent with the discharge plan as it was reviewed with her. All questions were fielded by provider and/or clinic staff prior to patient discharge.    Final Clinical Impressions / Urgent Care Diagnoses:   Final diagnoses:  Pregnancy, unspecified gestational age  Hypokalemia  Hypoglycemia    New Prescriptions:  [place] Controlled Substance Registry consulted? Not Applicable  Meds ordered this encounter  Medications  . potassium chloride 20 MEQ TBCR    Sig: Take 20 mEq by mouth daily for 3 days.    Dispense:  3 tablet    Refill:  0    Recommended Follow up Care:  Patient encouraged to follow up with the following provider within the specified time frame, or sooner as dictated by the severity of her symptoms. As always, she was instructed that for any urgent/emergent care needs, she should seek care either here or in the emergency department for more immediate evaluation.  Follow-up Information    Schedule an appointment as soon as possible for a visit  with Dr. Vergie Living.         NOTE: This note was prepared using Scientist, clinical (histocompatibility and immunogenetics) along with smaller Lobbyist. Despite my best ability to proofread, there is the potential that transcriptional errors may still occur from this process, and are completely unintentional.    Verlee Monte, NP 09/17/19 [phone]

## 2019-09-21 ENCOUNTER — Emergency Department: Payer: Medicaid Other

## 2019-09-21 ENCOUNTER — Ambulatory Visit
Admission: EM | Admit: 2019-09-21 | Discharge: 2019-09-21 | Discharge status: Against Medical Advice | Payer: Medicaid Other | Attending: Internal Medicine | Admitting: Internal Medicine

## 2019-09-21 ENCOUNTER — Emergency Department
Admission: EM | Admit: 2019-09-21 | Discharge: 2019-09-21 | Disposition: A | Discharge status: Home / Self Care | Payer: Medicaid Other | Attending: Emergency Medicine | Admitting: Emergency Medicine

## 2019-09-21 ENCOUNTER — Encounter: Payer: Self-pay | Admitting: *Deleted

## 2019-09-21 ENCOUNTER — Encounter: Payer: Self-pay | Admitting: Emergency Medicine

## 2019-09-21 ENCOUNTER — Other Ambulatory Visit: Payer: Self-pay

## 2019-09-21 DIAGNOSIS — Z79899 Other long term (current) drug therapy: Secondary | ICD-10-CM | POA: Diagnosis not present

## 2019-09-21 DIAGNOSIS — O26899 Other specified pregnancy related conditions, unspecified trimester: Secondary | ICD-10-CM

## 2019-09-21 DIAGNOSIS — Z3A25 25 weeks gestation of pregnancy: Secondary | ICD-10-CM | POA: Insufficient documentation

## 2019-09-21 DIAGNOSIS — O26892 Other specified pregnancy related conditions, second trimester: Secondary | ICD-10-CM | POA: Insufficient documentation

## 2019-09-21 DIAGNOSIS — Z3A01 Less than 8 weeks gestation of pregnancy: Secondary | ICD-10-CM | POA: Diagnosis present

## 2019-09-21 DIAGNOSIS — R109 Unspecified abdominal pain: Secondary | ICD-10-CM | POA: Insufficient documentation

## 2019-09-21 DIAGNOSIS — Z87891 Personal history of nicotine dependence: Secondary | ICD-10-CM | POA: Insufficient documentation

## 2019-09-21 DIAGNOSIS — E876 Hypokalemia: Secondary | ICD-10-CM | POA: Diagnosis present

## 2019-09-21 DIAGNOSIS — R10819 Abdominal tenderness, unspecified site: Secondary | ICD-10-CM

## 2019-09-21 DIAGNOSIS — Z8759 Personal history of other complications of pregnancy, childbirth and the puerperium: Secondary | ICD-10-CM | POA: Insufficient documentation

## 2019-09-21 DIAGNOSIS — Z3492 Encounter for supervision of normal pregnancy, unspecified, second trimester: Secondary | ICD-10-CM

## 2019-09-21 LAB — BASIC METABOLIC PANEL
Anion gap: 9 (ref 5–15)
BUN: 7 mg/dL (ref 6–20)
CO2: 18 mmol/L — ABNORMAL LOW (ref 22–32)
Calcium: 8.6 mg/dL — ABNORMAL LOW (ref 8.9–10.3)
Chloride: 106 mmol/L (ref 98–111)
Creatinine, Ser: 0.34 mg/dL — ABNORMAL LOW (ref 0.44–1.00)
GFR calc Af Amer: >60 mL/min (ref >60)
GFR calc non Af Amer: >60 mL/min (ref >60)
Glucose, Bld: 86 mg/dL (ref 70–99)
Potassium: 3.4 mmol/L — ABNORMAL LOW (ref 3.5–5.1)
Sodium: 133 mmol/L — ABNORMAL LOW (ref 135–145)

## 2019-09-21 LAB — COMPREHENSIVE METABOLIC PANEL
ALT: 16 U/L (ref 0–44)
AST: 20 U/L (ref 15–41)
Albumin: 3 g/dL — ABNORMAL LOW (ref 3.5–5.0)
Alkaline Phosphatase: 70 U/L (ref 38–126)
Anion gap: 5 (ref 5–15)
BUN: 7 mg/dL (ref 6–20)
CO2: 23 mmol/L (ref 22–32)
Calcium: 8.6 mg/dL — ABNORMAL LOW (ref 8.9–10.3)
Chloride: 107 mmol/L (ref 98–111)
Creatinine, Ser: 0.41 mg/dL — ABNORMAL LOW (ref 0.44–1.00)
GFR calc Af Amer: >60 mL/min (ref >60)
GFR calc non Af Amer: >60 mL/min (ref >60)
Glucose, Bld: 82 mg/dL (ref 70–99)
Potassium: 3.3 mmol/L — ABNORMAL LOW (ref 3.5–5.1)
Sodium: 135 mmol/L (ref 135–145)
Total Bilirubin: 0.8 mg/dL (ref 0.3–1.2)
Total Protein: 7.1 g/dL (ref 6.5–8.1)

## 2019-09-21 LAB — URINALYSIS, COMPLETE (UACMP) WITH MICROSCOPIC
Bilirubin Urine: NEGATIVE
Glucose, UA: NEGATIVE mg/dL
Hgb urine dipstick: NEGATIVE
Ketones, ur: 20 mg/dL — AB
Leukocytes,Ua: NEGATIVE
Nitrite: NEGATIVE
Protein, ur: NEGATIVE mg/dL
Specific Gravity, Urine: 1.011 (ref 1.005–1.030)
pH: 6 (ref 5.0–8.0)

## 2019-09-21 LAB — CBC
HCT: 31.8 % — ABNORMAL LOW (ref 36.0–46.0)
Hemoglobin: 11 g/dL — ABNORMAL LOW (ref 12.0–15.0)
MCH: 31.1 pg (ref 26.0–34.0)
MCHC: 34.6 g/dL (ref 30.0–36.0)
MCV: 89.8 fL (ref 80.0–100.0)
Platelets: 185 10*3/uL (ref 150–400)
RBC: 3.54 MIL/uL — ABNORMAL LOW (ref 3.87–5.11)
RDW: 13.3 % (ref 11.5–15.5)
WBC: 8.2 10*3/uL (ref 4.0–10.5)
nRBC: 0 % (ref 0.0–0.2)

## 2019-09-21 LAB — HCG, QUANTITATIVE, PREGNANCY: hCG, Beta Chain, Quant, S: 88574 m[IU]/mL — ABNORMAL HIGH (ref <5)

## 2019-09-21 LAB — LIPASE, BLOOD: Lipase: 26 U/L (ref 11–51)

## 2019-09-21 NOTE — ED Provider Notes (Signed)
[facility] Emergency Department Provider Note   ____________________________________________    I have reviewed the triage vital signs and the nursing notes.   HISTORY  Chief Complaint Pregnancy related complaint    HPI Leslie Trevino is a 24 y.o. female with a history of miscarriage and molar pregnancy who reports she is approximately 5 to [redacted] weeks pregnant who was at [facility] earlier today had potassium level drawn and hCG.  Found to have significantly elevated hCG but which has decreased from prior.  Patient apparently left prior to results.  Results were called to the patient and told to come to the emergency department for ultrasound.  Patient denies abdominal pain.  Denies vaginal bleeding.  Past Medical History:  Diagnosis Date  . History of miscarriage   . Molar pregnancy     There are no problems to display for this patient.   History reviewed. No pertinent surgical history.  Prior to Admission medications   Medication Sig Start Date End Date Taking? Authorizing Provider  b complex vitamins tablet Take 1 tablet by mouth daily.    [provider]  ferrous sulfate 325 (65 FE) MG tablet Take 325 mg by mouth daily with breakfast.    [provider]  Multiple Vitamin (MULTIVITAMIN WITH MINERALS) TABS tablet Take 1 tablet by mouth daily.    [provider]  potassium chloride 20 MEQ TBCR Take 20 mEq by mouth daily for 3 days. 09/15/19 09/21/19  Verlee Monte, NP  Vitamin D, Ergocalciferol, (DRISDOL) 1.25 MG (50000 UNIT) CAPS capsule Take 1 capsule by mouth daily. 07/15/19   [provider]  Potassium 99 MG TABS Take by mouth.  09/21/19  [provider]     Allergies Patient has no known allergies.  History reviewed. No pertinent family history.  Social History Social History   Tobacco Use  . Smoking status: Former Smoker    Packs/day: 0.25    Types: Cigarettes  . Smokeless  tobacco: Never Used  Substance Use Topics  . Alcohol use: Not Currently  . Drug use: Never    Review of Systems  Constitutional: No fever/chills Eyes: No visual changes.  ENT: No sore throat. Cardiovascular: Denies chest pain. Respiratory: Denies shortness of breath. Gastrointestinal: As above Genitourinary: Negative for dysuria.  No vaginal bleeding Musculoskeletal: Negative for back pain. Skin: Negative for rash. Neurological: Negative for headaches or weakness   ____________________________________________   PHYSICAL EXAM:  VITAL SIGNS: ED Triage Vitals  Enc Vitals Group     BP 09/21/19 1906 127/66     Pulse Rate 09/21/19 1906 86     Resp 09/21/19 1906 16     Temp 09/21/19 1906 98.4 F (36.9 C)     Temp Source 09/21/19 1906 Oral     SpO2 09/21/19 1906 100 %     Weight 09/21/19 1907 85.3 kg (188 lb)     Height 09/21/19 1907 1.651 m (5\' 5" )     Head Circumference --      Peak Flow --      Pain Score 09/21/19 1907 0     Pain Loc --      Pain Edu? --      Excl. in GC? --     Constitutional: Alert and oriented. .   Nose: No congestion/rhinnorhea. Mouth/Throat: Mucous membranes are moist.   Neck:  Painless ROM Cardiovascular: Normal rate, regular rhythm.  Good peripheral circulation. Respiratory: Normal respiratory effort.  No retractions Gastrointestinal:  Soft and nontender.  Gravid appearance suggests patient is in second trimester Genitourinary: deferred Musculoskeletal.  Warm and well perfused Neurologic:  Normal speech and language. No gross focal neurologic deficits are appreciated.  Skin:  Skin is warm, dry and intact. No rash noted. Psychiatric: Mood and affect are normal. Speech and behavior are normal.  ____________________________________________   LABS (all labs ordered are listed, but only abnormal results are displayed)  Labs Reviewed  COMPREHENSIVE METABOLIC PANEL - Abnormal; Notable for the following components:      Result Value    Potassium 3.3 (*)    Creatinine, Ser 0.41 (*)    Calcium 8.6 (*)    Albumin 3.0 (*)    All other components within normal limits  CBC - Abnormal; Notable for the following components:   RBC 3.54 (*)    Hemoglobin 11.0 (*)    HCT 31.8 (*)    All other components within normal limits  LIPASE, BLOOD  URINALYSIS, COMPLETE (UACMP) WITH MICROSCOPIC   ____________________________________________  EKG  None ____________________________________________  RADIOLOGY  None ____________________________________________   PROCEDURES  Procedure(s) performed: No  Procedures   Critical Care performed: No ____________________________________________   INITIAL IMPRESSION / ASSESSMENT AND PLAN / ED COURSE  Pertinent labs & imaging results that were available during my care of the patient were reviewed by me and considered in my medical decision making (see chart for details).  Review of medical records demonstrates the patient had beta-hCG of 93,800 on the 23rd of this month, 88,574 today.  Will obtain ultrasound, noted history of molar pregnancy   Ultrasound demonstrates 25-week 5-day fetus.  Discussed with patient she is quite surprised by this, she will follow-up closely with her OB as soon as possible.     ____________________________________________   FINAL CLINICAL IMPRESSION(S) / ED DIAGNOSES  Final diagnoses:  Second trimester pregnancy        Note:  This document was prepared using Dragon voice recognition software and may include unintentional dictation errors.   Lavonia Drafts, MD 09/21/19 2146

## 2019-09-21 NOTE — ED Notes (Signed)
Patient left AMA. Contacted patient via phone to give her results of her Hcg level. Per provider, patient needs to go to the ED. Patient verbalized understanding.

## 2019-09-21 NOTE — ED Provider Notes (Signed)
[facility]    CSN: [id number] Arrival date & time: 09/21/19  1621      History   Chief Complaint Chief Complaint  Patient presents with  . Labs Only    HPI Leslie Trevino is a 24 y.o. female. who  Is here requesting HCG levels due to her abdomen being larger than it should be if she was [redacted] weeks pregnant and potasium levels. She is [redacted] weeks pregnant and was diagnosed with hypokalemia and pregnancy when he was here on 2/23. She has not been spotting. 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 She does have a PCP apt in 4 days.  Has hx of molar pregnancy as well.    Past Medical History:  Diagnosis Date  . History of miscarriage   . Molar pregnancy     There are no problems to display for this patient.   History reviewed. No pertinent surgical history.  OB History    Gravida  1   Para      Term      Preterm      AB      Living        SAB      TAB      Ectopic      Multiple      Live Births               Home Medications    Prior to Admission medications   Medication Sig Start Date End Date Taking? Authorizing Provider  b complex vitamins tablet Take 1 tablet by mouth daily.   Yes [provider]  ferrous sulfate 325 (65 FE) MG tablet Take 325 mg by mouth daily with breakfast.   Yes [provider]  Multiple Vitamin (MULTIVITAMIN WITH MINERALS) TABS tablet Take 1 tablet by mouth daily.   Yes [provider]  Potassium 99 MG TABS Take by mouth.   Yes [provider]  potassium chloride 20 MEQ TBCR Take 20 mEq by mouth daily for 3 days. 09/15/19 09/21/19 Yes Karen Kitchens, NP  Vitamin D, Ergocalciferol, (DRISDOL) 1.25 MG (50000 UNIT) CAPS capsule Take 1 capsule by mouth daily. 07/15/19  Yes [provider]    Family History History reviewed. No pertinent family history.  Social History Social History   Tobacco Use  . Smoking status: Former Smoker   Packs/day: 0.25    Types: Cigarettes  . Smokeless tobacco: Never Used  Substance Use Topics  . Alcohol use: Not Currently  . Drug use: Never     Allergies   Patient has no known allergies.   Review of Systems Review of Systems  Constitutional: Negative for fever.  Gastrointestinal: Positive for abdominal distention and abdominal pain.       Has been getting mild side abdominal pains told that was from her uterus expanding   Skin: Negative for rash.     Physical Exam Triage Vital Signs ED Triage Vitals  Enc Vitals Group     BP 09/21/19 1643 95/64     Pulse Rate 09/21/19 1643 80     Resp 09/21/19 1643 18     Temp 09/21/19 1643 98.6 F (37 C)     Temp Source 09/21/19 1643 Oral     SpO2 09/21/19 1643 98 %     Weight 09/21/19 1640 188 lb (85.3 kg)     Height 09/21/19 1640 5\' 5"  (1.651 m)  Head Circumference --      Peak Flow --      Pain Score 09/21/19 1639 0     Pain Loc --      Pain Edu? --      Excl. in GC? --    No data found.  Updated Vital Signs BP 95/64 (BP Location: Right Arm)   Pulse 80   Temp 98.6 F (37 C) (Oral)   Resp 18   Ht 5\' 5"  (1.651 m)   Wt 188 lb (85.3 kg)   LMP  (LMP Unknown)   SpO2 98%   BMI 31.28 kg/m   Visual Acuity Right Eye Distance:   Left Eye Distance:   Bilateral Distance:    Right Eye Near:   Left Eye Near:    Bilateral Near:     Physical Exam Vitals and nursing note reviewed.  Constitutional:      General: She is not in acute distress.    Appearance: She is obese. She is not toxic-appearing.  HENT:     Left Ear: External ear normal.  Eyes:     General: No scleral icterus.    Conjunctiva/sclera: Conjunctivae normal.  Cardiovascular:     Heart sounds: Normal heart sounds. No murmur.  Pulmonary:     Effort: Pulmonary effort is normal.     Breath sounds: Normal breath sounds.  Abdominal:     General: Bowel sounds are normal. There is distension.     Palpations: Abdomen is soft.     Tenderness: There is no  abdominal tenderness. There is no guarding.     Comments: Her abdomen is very protruded but I dont feel a fundus.   Musculoskeletal:        General: Normal range of motion.     Cervical back: Neck supple.  Skin:    General: Skin is warm and dry.     Findings: No rash.  Neurological:     Mental Status: She is alert and oriented to person, place, and time.     Gait: Gait normal.  Psychiatric:        Thought Content: Thought content normal.        Judgment: Judgment normal.     Comments: She is cantankerously and seems angry. Was very rude to the rooming nurse      UC Treatments / Results  Labs (all labs ordered are listed, but only abnormal results are displayed) Labs Reviewed  BASIC METABOLIC PANEL  HCG, QUANTITATIVE, PREGNANCY    EKG   Radiology No results found.  Procedures Procedures (including critical care time)  Medications Ordered in UC Medications - No data to display  Initial Impression / Assessment and Plan / UC Course  I have reviewed the triage vital signs and the nursing notes. Pertinent labs & imaging results that were available during my care of the patient were reviewed by me and considered in my medical decision making (see chart for details). Pt left without staying for the results, so the RN called pt and told her her results and needs to go to ER for .   Final Clinical Impressions(s) / UC Diagnoses   Final diagnoses:  None   Discharge Instructions   None    ED Prescriptions    None     PDMP not reviewed this encounter.   [place], Garey Ham 09/21/19 1818

## 2019-09-21 NOTE — ED Triage Notes (Signed)
Pt presents to urgent care for lab work. She states that she was told that she could come back and her hormone levels and potassium check if she could not get in with her regular OB. She states that she could not get in with her doctor until March 24 th, however it is in the chart she has an appt with [facility]CP on March 5 th.

## 2019-09-21 NOTE — ED Triage Notes (Signed)
Pt reports cramping abd pain that started two days ago. No NVD and no fevers. No urinary changes.   Pt is [redacted] weeks pregnant, denies vaginal bleeding or discharge. No abd trauma. This is patient's 4th pregnancy with a hx of three miscarriages.

## 2019-09-21 NOTE — Discharge Instructions (Addendum)
Your ultrasound demonstrates that you are approximately 25 weeks and 5 days please follow-up closely with the obstetrician

## 2019-09-23 LAB — OB RESULTS CONSOLE VARICELLA ZOSTER ANTIBODY, IGG: Varicella: IMMUNE

## 2019-09-23 LAB — OB RESULTS CONSOLE GC/CHLAMYDIA
Chlamydia: NEGATIVE
Gonorrhea: NEGATIVE

## 2019-09-23 LAB — OB RESULTS CONSOLE HEPATITIS B SURFACE ANTIGEN: Hepatitis B Surface Ag: NEGATIVE

## 2019-10-11 LAB — OB RESULTS CONSOLE RUBELLA ANTIBODY, IGM: Rubella: IMMUNE

## 2019-12-03 LAB — OB RESULTS CONSOLE GBS: GBS: NEGATIVE

## 2019-12-03 LAB — OB RESULTS CONSOLE RPR: RPR: NONREACTIVE

## 2019-12-21 ENCOUNTER — Inpatient Hospital Stay
Admission: EM | Admit: 2019-12-21 | Discharge: 2019-12-24 | DRG: 806 | Disposition: A | Discharge status: Home / Self Care | Payer: Medicaid Other | Attending: Certified Nurse Midwife | Admitting: Certified Nurse Midwife

## 2019-12-21 ENCOUNTER — Other Ambulatory Visit: Payer: Self-pay

## 2019-12-21 ENCOUNTER — Inpatient Hospital Stay: Payer: Medicaid Other | Admitting: Anesthesiology

## 2019-12-21 DIAGNOSIS — Z87891 Personal history of nicotine dependence: Secondary | ICD-10-CM | POA: Diagnosis not present

## 2019-12-21 DIAGNOSIS — Z20822 Contact with and (suspected) exposure to covid-19: Secondary | ICD-10-CM | POA: Diagnosis present

## 2019-12-21 DIAGNOSIS — O1495 Unspecified pre-eclampsia, complicating the puerperium: Secondary | ICD-10-CM | POA: Diagnosis present

## 2019-12-21 DIAGNOSIS — D62 Acute posthemorrhagic anemia: Secondary | ICD-10-CM | POA: Diagnosis not present

## 2019-12-21 DIAGNOSIS — Z3A38 38 weeks gestation of pregnancy: Secondary | ICD-10-CM

## 2019-12-21 DIAGNOSIS — O9081 Anemia of the puerperium: Secondary | ICD-10-CM | POA: Diagnosis not present

## 2019-12-21 DIAGNOSIS — O26893 Other specified pregnancy related conditions, third trimester: Secondary | ICD-10-CM | POA: Diagnosis present

## 2019-12-21 LAB — COMPREHENSIVE METABOLIC PANEL
ALT: 22 U/L (ref 0–44)
AST: 32 U/L (ref 15–41)
Albumin: 2.5 g/dL — ABNORMAL LOW (ref 3.5–5.0)
Alkaline Phosphatase: 212 U/L — ABNORMAL HIGH (ref 38–126)
Anion gap: 9 (ref 5–15)
BUN: <5 mg/dL — ABNORMAL LOW (ref 6–20)
CO2: 20 mmol/L — ABNORMAL LOW (ref 22–32)
Calcium: 8.4 mg/dL — ABNORMAL LOW (ref 8.9–10.3)
Chloride: 109 mmol/L (ref 98–111)
Creatinine, Ser: 0.58 mg/dL (ref 0.44–1.00)
GFR calc Af Amer: >60 mL/min (ref >60)
GFR calc non Af Amer: >60 mL/min (ref >60)
Glucose, Bld: 101 mg/dL — ABNORMAL HIGH (ref 70–99)
Potassium: 3.9 mmol/L (ref 3.5–5.1)
Sodium: 138 mmol/L (ref 135–145)
Total Bilirubin: 1.1 mg/dL (ref 0.3–1.2)
Total Protein: 6.4 g/dL — ABNORMAL LOW (ref 6.5–8.1)

## 2019-12-21 LAB — CBC WITH DIFFERENTIAL/PLATELET
Abs Immature Granulocytes: 0.06 10*3/uL (ref 0.00–0.07)
Basophils Absolute: 0 10*3/uL (ref 0.0–0.1)
Basophils Relative: 0 %
Eosinophils Absolute: 0 10*3/uL (ref 0.0–0.5)
Eosinophils Relative: 0 %
HCT: 31.8 % — ABNORMAL LOW (ref 36.0–46.0)
Hemoglobin: 10.4 g/dL — ABNORMAL LOW (ref 12.0–15.0)
Immature Granulocytes: 1 %
Lymphocytes Relative: 9 %
Lymphs Abs: 1.1 10*3/uL (ref 0.7–4.0)
MCH: 28.2 pg (ref 26.0–34.0)
MCHC: 32.7 g/dL (ref 30.0–36.0)
MCV: 86.2 fL (ref 80.0–100.0)
Monocytes Absolute: 0.6 10*3/uL (ref 0.1–1.0)
Monocytes Relative: 5 %
Neutro Abs: 10 10*3/uL — ABNORMAL HIGH (ref 1.7–7.7)
Neutrophils Relative %: 85 %
Platelets: 184 10*3/uL (ref 150–400)
RBC: 3.69 MIL/uL — ABNORMAL LOW (ref 3.87–5.11)
RDW: 15.5 % (ref 11.5–15.5)
WBC: 11.8 10*3/uL — ABNORMAL HIGH (ref 4.0–10.5)
nRBC: 0 % (ref 0.0–0.2)

## 2019-12-21 LAB — CBC
HCT: 30.7 % — ABNORMAL LOW (ref 36.0–46.0)
Hemoglobin: 10.1 g/dL — ABNORMAL LOW (ref 12.0–15.0)
MCH: 28.1 pg (ref 26.0–34.0)
MCHC: 32.9 g/dL (ref 30.0–36.0)
MCV: 85.3 fL (ref 80.0–100.0)
Platelets: 167 10*3/uL (ref 150–400)
RBC: 3.6 MIL/uL — ABNORMAL LOW (ref 3.87–5.11)
RDW: 15.6 % — ABNORMAL HIGH (ref 11.5–15.5)
WBC: 5.4 10*3/uL (ref 4.0–10.5)
nRBC: 0.4 % — ABNORMAL HIGH (ref 0.0–0.2)

## 2019-12-21 LAB — SARS CORONAVIRUS 2 BY RT PCR (HOSPITAL ORDER, PERFORMED IN ~~LOC~~ HOSPITAL LAB): SARS Coronavirus 2: NEGATIVE

## 2019-12-21 LAB — PROTEIN / CREATININE RATIO, URINE
Creatinine, Urine: 123 mg/dL
Creatinine, Urine: 18 mg/dL
Protein Creatinine Ratio: 0.97 mg/mg{Cre} — ABNORMAL HIGH (ref 0.00–0.15)
Protein Creatinine Ratio: 0.44 mg/mg{Cre} — ABNORMAL HIGH (ref 0.00–0.15)
Total Protein, Urine: 119 mg/dL
Total Protein, Urine: 8 mg/dL

## 2019-12-21 LAB — ABO/RH: ABO/RH(D): A POS

## 2019-12-21 LAB — MAGNESIUM: Magnesium: 3.6 mg/dL — ABNORMAL HIGH (ref 1.7–2.4)

## 2019-12-21 LAB — TYPE AND SCREEN
ABO/RH(D): A POS
Antibody Screen: NEGATIVE

## 2019-12-21 MED ORDER — FERROUS SULFATE 325 (65 FE) MG PO TABS
325.0000 mg | ORAL_TABLET | Freq: Two times a day (BID) | ORAL | Status: DC
  Administered 2019-12-21 – 2019-12-24 (×6): 325 mg via ORAL
  Filled 2019-12-21 (×6): qty 1

## 2019-12-21 MED ORDER — BUPIVACAINE HCL (PF) 0.25 % IJ SOLN
INTRAMUSCULAR | Status: DC | PRN
  Administered 2019-12-21: 1 mL via INTRATHECAL

## 2019-12-21 MED ORDER — SIMETHICONE 80 MG PO CHEW: 80.0000 mg | CHEWABLE_TABLET | ORAL | Status: DC | PRN

## 2019-12-21 MED ORDER — OXYTOCIN 10 UNIT/ML IJ SOLN
INTRAMUSCULAR | Status: AC
  Filled 2019-12-21: qty 2

## 2019-12-21 MED ORDER — OXYCODONE HCL 5 MG PO TABS: 10.0000 mg | ORAL_TABLET | ORAL | Status: DC | PRN

## 2019-12-21 MED ORDER — ACETAMINOPHEN 325 MG PO TABS: 650.0000 mg | ORAL_TABLET | ORAL | Status: DC | PRN

## 2019-12-21 MED ORDER — DIPHENHYDRAMINE HCL 50 MG/ML IJ SOLN: 12.5000 mg | INTRAMUSCULAR | Status: DC | PRN

## 2019-12-21 MED ORDER — PHENYLEPHRINE 40 MCG/ML (10ML) SYRINGE FOR IV PUSH (FOR BLOOD PRESSURE SUPPORT): 80.0000 ug | PREFILLED_SYRINGE | INTRAVENOUS | Status: DC | PRN

## 2019-12-21 MED ORDER — OXYTOCIN BOLUS FROM INFUSION: 500.0000 mL | Freq: Once | INTRAVENOUS | Status: AC

## 2019-12-21 MED ORDER — OXYCODONE-ACETAMINOPHEN 5-325 MG PO TABS: 1.0000 | ORAL_TABLET | ORAL | Status: DC | PRN

## 2019-12-21 MED ORDER — CALCIUM GLUCONATE 10 % IV SOLN
INTRAVENOUS | Status: AC
  Filled 2019-12-21: qty 10

## 2019-12-21 MED ORDER — MAGNESIUM SULFATE 40 GM/1000ML IV SOLN
2.0000 g/h | INTRAVENOUS | Status: DC
  Administered 2019-12-21 – 2019-12-22 (×2): 2 g/h via INTRAVENOUS
  Filled 2019-12-21 (×2): qty 1000

## 2019-12-21 MED ORDER — ONDANSETRON HCL 4 MG/2ML IJ SOLN: 4.0000 mg | Freq: Four times a day (QID) | INTRAMUSCULAR | Status: DC | PRN

## 2019-12-21 MED ORDER — LABETALOL HCL 5 MG/ML IV SOLN: 80.0000 mg | INTRAVENOUS | Status: DC | PRN

## 2019-12-21 MED ORDER — MISOPROSTOL 200 MCG PO TABS
ORAL_TABLET | ORAL | Status: AC
  Filled 2019-12-21: qty 4

## 2019-12-21 MED ORDER — OXYCODONE HCL 5 MG PO TABS: 5.0000 mg | ORAL_TABLET | ORAL | Status: DC | PRN

## 2019-12-21 MED ORDER — DIPHENHYDRAMINE HCL 25 MG PO CAPS: 25.0000 mg | ORAL_CAPSULE | Freq: Four times a day (QID) | ORAL | Status: DC | PRN

## 2019-12-21 MED ORDER — EPHEDRINE 5 MG/ML INJ: 10.0000 mg | INTRAVENOUS | Status: DC | PRN

## 2019-12-21 MED ORDER — TETANUS-DIPHTH-ACELL PERTUSSIS 5-2.5-18.5 LF-MCG/0.5 IM SUSP
0.5000 mL | Freq: Once | INTRAMUSCULAR | Status: DC
  Filled 2019-12-21: qty 0.5

## 2019-12-21 MED ORDER — OXYTOCIN 40 UNITS IN NORMAL SALINE INFUSION - SIMPLE MED: 2.5000 [IU]/h | INTRAVENOUS | Status: DC

## 2019-12-21 MED ORDER — DIBUCAINE (PERIANAL) 1 % EX OINT: 1.0000 "application " | TOPICAL_OINTMENT | CUTANEOUS | Status: DC | PRN

## 2019-12-21 MED ORDER — OXYCODONE-ACETAMINOPHEN 5-325 MG PO TABS: 2.0000 | ORAL_TABLET | ORAL | Status: DC | PRN

## 2019-12-21 MED ORDER — MAGNESIUM SULFATE BOLUS VIA INFUSION
6.0000 g | Freq: Once | INTRAVENOUS | Status: AC
  Administered 2019-12-21: 6 g via INTRAVENOUS
  Filled 2019-12-21: qty 1000

## 2019-12-21 MED ORDER — LACTATED RINGERS IV SOLN: INTRAVENOUS | Status: DC

## 2019-12-21 MED ORDER — OXYTOCIN 40 UNITS IN NORMAL SALINE INFUSION - SIMPLE MED
INTRAVENOUS | Status: AC
  Administered 2019-12-21: 500 mL via INTRAVENOUS
  Filled 2019-12-21: qty 1000

## 2019-12-21 MED ORDER — LIDOCAINE HCL (PF) 1 % IJ SOLN
INTRAMUSCULAR | Status: AC
  Filled 2019-12-21: qty 30

## 2019-12-21 MED ORDER — ACETAMINOPHEN 325 MG PO TABS
650.0000 mg | ORAL_TABLET | ORAL | Status: DC | PRN
  Administered 2019-12-21 – 2019-12-24 (×3): 650 mg via ORAL
  Filled 2019-12-21 (×4): qty 2

## 2019-12-21 MED ORDER — HYDRALAZINE HCL 20 MG/ML IJ SOLN: 10.0000 mg | INTRAMUSCULAR | Status: DC | PRN

## 2019-12-21 MED ORDER — LABETALOL HCL 5 MG/ML IV SOLN: 40.0000 mg | INTRAVENOUS | Status: DC | PRN

## 2019-12-21 MED ORDER — LACTATED RINGERS IV SOLN: 500.0000 mL | INTRAVENOUS | Status: DC | PRN

## 2019-12-21 MED ORDER — SOD CITRATE-CITRIC ACID 500-334 MG/5ML PO SOLN: 30.0000 mL | ORAL | Status: DC | PRN

## 2019-12-21 MED ORDER — BENZOCAINE-MENTHOL 20-0.5 % EX AERO: 1.0000 "application " | INHALATION_SPRAY | CUTANEOUS | Status: DC | PRN

## 2019-12-21 MED ORDER — ONDANSETRON HCL 4 MG PO TABS: 4.0000 mg | ORAL_TABLET | ORAL | Status: DC | PRN

## 2019-12-21 MED ORDER — LIDOCAINE HCL (PF) 1 % IJ SOLN: 30.0000 mL | INTRAMUSCULAR | Status: DC | PRN

## 2019-12-21 MED ORDER — PRENATAL MULTIVITAMIN CH
1.0000 | ORAL_TABLET | Freq: Every day | ORAL | Status: DC
  Administered 2019-12-21 – 2019-12-22 (×2): 1 via ORAL
  Filled 2019-12-21 (×2): qty 1

## 2019-12-21 MED ORDER — LIDOCAINE HCL (PF) 1 % IJ SOLN
INTRAMUSCULAR | Status: DC | PRN
  Administered 2019-12-21: 2 mL via INTRADERMAL

## 2019-12-21 MED ORDER — FENTANYL 2.5 MCG/ML W/ROPIVACAINE 0.15% IN NS 100 ML EPIDURAL (ARMC)
12.0000 mL/h | EPIDURAL | Status: DC
  Administered 2019-12-21: 12 mL/h via EPIDURAL

## 2019-12-21 MED ORDER — LACTATED RINGERS IV SOLN: 500.0000 mL | Freq: Once | INTRAVENOUS | Status: DC

## 2019-12-21 MED ORDER — WITCH HAZEL-GLYCERIN EX PADS: 1.0000 "application " | MEDICATED_PAD | CUTANEOUS | Status: DC | PRN

## 2019-12-21 MED ORDER — FENTANYL 2.5 MCG/ML W/ROPIVACAINE 0.15% IN NS 100 ML EPIDURAL (ARMC)
EPIDURAL | Status: AC
  Filled 2019-12-21: qty 100

## 2019-12-21 MED ORDER — IBUPROFEN 600 MG PO TABS
600.0000 mg | ORAL_TABLET | Freq: Four times a day (QID) | ORAL | Status: DC
  Administered 2019-12-21 – 2019-12-24 (×8): 600 mg via ORAL
  Filled 2019-12-21 (×9): qty 1

## 2019-12-21 MED ORDER — LABETALOL HCL 5 MG/ML IV SOLN: 20.0000 mg | INTRAVENOUS | Status: DC | PRN

## 2019-12-21 MED ORDER — COCONUT OIL OIL: 1.0000 "application " | TOPICAL_OIL | Status: DC | PRN

## 2019-12-21 MED ORDER — ONDANSETRON HCL 4 MG/2ML IJ SOLN: 4.0000 mg | INTRAMUSCULAR | Status: DC | PRN

## 2019-12-21 MED ORDER — DOCUSATE SODIUM 100 MG PO CAPS
100.0000 mg | ORAL_CAPSULE | Freq: Two times a day (BID) | ORAL | Status: DC
  Administered 2019-12-22 – 2019-12-24 (×4): 100 mg via ORAL
  Filled 2019-12-21 (×5): qty 1

## 2019-12-21 MED ORDER — AMMONIA AROMATIC IN INHA
RESPIRATORY_TRACT | Status: AC
  Filled 2019-12-21: qty 10

## 2019-12-21 NOTE — Lactation Note (Signed)
This note was copied from a baby's chart. Lactation Consultation Note  Patient Name: Leslie Trevino YTSSQ'S Date: 12/21/2019   Mom's choice on admission was to breast and formula feed.  Mom has only given formula.  Mom is in [place] on Mag.  When went in to check on her, she is adamant about only formula feeding.  She said her attempt to breast feed her 1st was a "hot mess" and she did not want to try again.  She also reports not having enough milk to feed baby.  Information sheet given on formula preparation and feeding and reviewed.  Discussed how to dry up her milk such as no stimulation, well-fitting supportive bra, cabbage leaves, no warmth, cold compresses, etc.  Explained differences and treatment for full breasts, engorgement, plugged ducts and mastitis and when and why to seek help from physician.  Maternal Data    Feeding Feeding Type: Formula Nipple Type: Slow - flow  LATCH Score                   Interventions    Lactation Tools Discussed/Used     Consult Status      Louis Meckel 12/21/2019, 9:42 PM

## 2019-12-21 NOTE — Anesthesia Procedure Notes (Signed)
Epidural Patient location during procedure: OB Start time: 12/21/2019 8:38 AM End time: 12/21/2019 8:45 AM  Staffing Anesthesiologist: Karleen Hampshire, MD Performed: anesthesiologist   Preanesthetic Checklist Completed: patient identified, IV checked, site marked, risks and benefits discussed, surgical consent, monitors and equipment checked, pre-op evaluation and timeout performed  Epidural Patient position: sitting Prep: ChloraPrep Patient monitoring: heart rate, continuous pulse ox and blood pressure Approach: midline Location: L3-L4 Injection technique: LOR saline  Needle:  Needle type: Tuohy  Needle gauge: 18 G Needle length: 9 cm and 9 Needle insertion depth: 7 cm Catheter type: closed end flexible Catheter size: 20 Guage Catheter at skin depth: 11 cm  Assessment Events: blood not aspirated, injection not painful, no injection resistance, no paresthesia and negative IV test  Additional Notes CSE Clear CSF, smooth aspiration 1 mL 0.25% bupivacaine given intrathecally Epidural then placed without issues, LOR at 7 cm, 11 cm at skin  Risks and benefits of procedure discussed with patient.  Risks including but not limited to infection, spinal/epidural hematoma, nerve injury, post dural puncture headache, and inadequate/failed block.  Patient expressed understanding and consented to epidural placement. Negative paresthesia on injection.  Patient tolerated the procedure well with no immediate complications.   Reason for block:procedure for pain

## 2019-12-21 NOTE — Discharge Summary (Signed)
Obstetrical Discharge Summary  Patient Name: Leslie Trevino DOB: 07/06/1996 MRN: [id number]  Date of Admission: 12/21/2019 Date of Delivery: 12/21/19 Delivered by: Linda Hedges Date of Discharge: 12/24/2019  Primary OB: [facility]  [id number] last menstrual period was 03/26/2019. EDC Estimated Date of Delivery: 12/31/19 Gestational Age at Delivery: [redacted]w[redacted]d  Antepartum complications:  1. Hx D&E for partial molar preg 09/2016 2. Late entry to to prenatal care 3. Elevated 1hr GTT  4. Hypokalemia 5. Pica Admitting Diagnosis: Labor Secondary Diagnosis: Patient Active Problem List   Diagnosis Date Noted  . NSVD (normal spontaneous vaginal delivery) 12/21/2019  . Preeclampsia in postpartum period 12/21/2019    Augmentation: AROM Complications: None  Intrapartum complications/course: Shoulder dystocia x 30 sec Delivery Type: spontaneous vaginal delivery Anesthesia: epidural Placenta: spontaneous Laceration: 1st degree perineal Episiotomy: none Newborn Data: Live born female  Birth Weight:  3740g   8#3.9 APGAR: 5, 9  Newborn Delivery   Birth date/time: 12/21/2019 10:55:00 Delivery type: Vaginal, Spontaneous      24yo G4P1021 at 39+4wks presenting with contractions, AROM with thick MSAF.  She progressed to complete and pushed over an intact perineum and delivered the fetal head, 30 sec shoulder dystocia resolved with McRoberts. She was in control the whole time, and the baby placed on the maternal abdomen. Delayed cord clamping and the FOB cut his cord, while he was skin to skin. The placenta delivered spontaneously and intact. 1st degree laceration repaired. Mom and baby tolerated the procedure well.   Postpartum Procedures: none  Post partum course:  Patient had a postpartum course complicated by Preeclampsia and magnesium sulfate infusion x 24 hours postpartum.  By time of discharge on PPD#3, her pain was controlled on oral pain medications; she had appropriate  lochia and was ambulating, voiding without difficulty and tolerating regular diet.  Blood pressures were controlled with oral antihypertensives. She was deemed stable for discharge to home.    Discharge Physical Exam:  BP 117/81 (BP Location: Left Arm)   Pulse 81   Temp 99 F (37.2 C) (Oral)   Resp 20   Ht '5\' 5"'  (1.651 m)   Wt 94.8 kg   LMP 03/26/2019   SpO2 99%   Breastfeeding Unknown   BMI 34.78 kg/m   General: alert and no distress Pulm: normal respiratory effort Lochia: appropriate Abdomen: soft, NT Uterine Fundus: firm, below umbilicus Perineum: approximated  Extremities: No evidence of DVT seen on physical exam. No lower extremity edema. Edinburgh:  Edinburgh Postnatal Depression Scale Screening Tool 12/22/2019 12/22/2019  I have been able to laugh and see the funny side of things. 0 (No Data)  I have looked forward with enjoyment to things. 0 -  I have blamed myself unnecessarily when things went wrong. 0 -  I have been anxious or worried for no good reason. 3 -  I have felt scared or panicky for no good reason. 0 -  Things have been getting on top of me. 0 -  I have been so unhappy that I have had difficulty sleeping. 0 -  I have felt sad or miserable. 0 -  I have been so unhappy that I have been crying. 0 -  The thought of harming myself has occurred to me. 0 -  Edinburgh Postnatal Depression Scale Total 3 -     Labs: CBC Latest Ref Rng & Units 12/22/2019 12/21/2019 12/21/2019  WBC 4.0 - 10.5 K/uL 8.7 11.8(H) 5.4  Hemoglobin 12.0 - 15.0 g/dL 9.4(L) 10.4(L) 10.1(L)  Hematocrit  36.0 - 46.0 % 27.4(L) 31.8(L) 30.7(L)  Platelets 150 - 400 K/uL 198 184 167   A POS Performed at [facility], [address] [place], [place] [address]  Hemoglobin  Date Value Ref Range Status  12/22/2019 9.4 (L) 12.0 - 15.0 g/dL Final   HCT  Date Value Ref Range Status  12/22/2019 27.4 (L) 36.0 - 46.0 % Final    Disposition: stable, discharge to home Baby Feeding:  formula Baby Disposition: home with mom  Contraception: condoms  Prenatal Labs:  Blood type/Rh A pos  Antibody screen neg  Rubella Immune  Varicella Immune  RPR NR  HBsAg Neg  HIV NR  GC neg  Chlamydia neg  Genetic screening MaterniT21 negative  1 hour GTT 151  3 hour GTT 92-142-105-84  GBS neg    Rh Immune globulin given: n/a Rubella vaccine given: n/a Varicella vaccine given: n/a Tdap vaccine given in AP or PP setting: given 10/06/19 Flu vaccine given in AP or PP setting: declined  Plan: Oria Klimas was discharged to home in good condition. Follow up in office on Monday 12/28/2019 for blood pressure check.  Follow-up appointment with delivering provider in 6 weeks.  Discharge Instructions: Per After Visit Summary. Activity: Advance as tolerated. Pelvic rest for 6 weeks.   Diet: Regular Discharge Medications: Allergies as of 12/24/2019   No Known Allergies     Medication List    TAKE these medications   acetaminophen 325 MG tablet Commonly known as: Tylenol Take 2 tablets (650 mg total) by mouth every 4 (four) hours as needed (for pain scale < 4).   b complex vitamins tablet Take 1 tablet by mouth daily.   Blood Pressure Kit 1 kit by Does not apply route daily. Check blood pressure once daily.  Call provider for blood pressures 160/110 or greater.   ferrous sulfate 325 (65 FE) MG tablet Take 325 mg by mouth daily with breakfast.   ibuprofen 600 MG tablet Commonly known as: ADVIL Take 1 tablet (600 mg total) by mouth every 6 (six) hours.   multivitamin with minerals Tabs tablet Take 1 tablet by mouth daily.   NIFEdipine 60 MG 24 hr tablet Commonly known as: ADALAT CC Take 1 tablet (60 mg total) by mouth daily. Start taking on: December 25, 2019   Potassium Chloride ER 20 MEQ Tbcr Take 20 mEq by mouth daily for 3 days.   Vitamin D (Ergocalciferol) 1.25 MG (50000 UNIT) Caps capsule Commonly known as: DRISDOL Take 1 capsule by mouth daily.       Outpatient follow up:  Follow-up Information    Linda Hedges, CNM. Schedule an appointment as soon as possible for a visit in 2 day(s).   Specialty: Certified Nurse Midwife Why: Postpartum follow up appointment on Monday June 7th at 10:15 AM for a blood pressure check with Lisette Grinder   Contact information: [place] [address] [phone]        [facility] Follow up on 12/28/2019.   Why: for blood pressure check  Contact information: [place] [address] [phone]          Signed:  Drinda Butts, CNM Certified Nurse Midwife [facility]

## 2019-12-21 NOTE — Progress Notes (Signed)
Labor Progress Note  Leslie Trevino is a 24 y.o. G2P1001 at [redacted]w[redacted]d by ultrasound admitted for active labor  Subjective: Pt comfortable with epidural but is feeling pressure  Objective: BP 136/88   Pulse (!) 58   Temp 98 F (36.7 C) (Oral)   Resp 16   Ht 5\' 5"  (1.651 m)   Wt 94.8 kg   LMP 03/26/2019   SpO2 97%   BMI 34.78 kg/m   Fetal Assessment: FHT:  FHR: 135 bpm, variability: moderate,  accelerations:  Abscent,  decelerations:  Absent Category/reactivity:  Category I UC:   regular, every 2 minutes SVE:    Dilation: 9cm  Effacement: 100%  Station:  0  Consistency: n/a  Position: n/a  Membrane status:AROM'd Amniotic color: Thick MSAF  Labs: Lab Results  Component Value Date   WBC 5.4 12/21/2019   HGB 10.1 (L) 12/21/2019   HCT 30.7 (L) 12/21/2019   MCV 85.3 12/21/2019   PLT 167 12/21/2019    Assessment / Plan: Spontaneous labor, progressing normally  Labor: Progressing normally Preeclampsia:  136/88 Fetal Wellbeing:  Category I Pain Control:  Epidural I/D:  GBS neg, Afebrile, AROM'd at 1040 Anticipated MOD:  NSVD  12/23/2019, CNM 12/21/2019, 10:38 AM

## 2019-12-21 NOTE — Progress Notes (Signed)
CNM notified of Patient having elevated BP of 151/85. Orders given for preeclampsia Labs and PC ratio.

## 2019-12-21 NOTE — Anesthesia Preprocedure Evaluation (Addendum)
Anesthesia Evaluation  Patient identified by MRN, date of birth, ID band Patient awake    Reviewed: Allergy & Precautions, H&P , NPO status , Patient's Chart, lab work & pertinent test results  Airway Mallampati: III  TM Distance: >3 FB Neck ROM: full    Dental  (+) Teeth Intact   Pulmonary former smoker,           Cardiovascular Exercise Tolerance: Good (-) hypertensionnegative cardio ROS       Neuro/Psych    GI/Hepatic negative GI ROS,   Endo/Other    Renal/GU   negative genitourinary   Musculoskeletal   Abdominal   Peds  Hematology negative hematology ROS (+)   Anesthesia Other Findings Past Medical History: No date: History of miscarriage No date: Molar pregnancy  History reviewed. No pertinent surgical history.  BMI    Body Mass Index: 34.78 kg/m      Reproductive/Obstetrics (+) Pregnancy                            Anesthesia Physical Anesthesia Plan  ASA: II  Anesthesia Plan: Combined Spinal and Epidural   Post-op Pain Management:    Induction:   PONV Risk Score and Plan:   Airway Management Planned:   Additional Equipment:   Intra-op Plan:   Post-operative Plan:   Informed Consent: I have reviewed the patients History and Physical, chart, labs and discussed the procedure including the risks, benefits and alternatives for the proposed anesthesia with the patient or authorized representative who has indicated his/her understanding and acceptance.       Plan Discussed with: Anesthesiologist  Anesthesia Plan Comments:         Anesthesia Quick Evaluation

## 2019-12-21 NOTE — Progress Notes (Signed)
To patient bedside after reports of elevated BP with elevated P;C ratio. No prior hx of elevated BP.  Neg urine protein on 12/03/19.  Pt is s/p 3g bolus iv mag, now on 2g/hr. She denies headache, blurry vision, SOB and abdominal pain. She is very hungry.  Vitals:   12/21/19 1441 12/21/19 1521  BP: 137/90 121/84  Pulse: 68 (!) 59  Resp: 16   Temp:    SpO2: 99%     PR:  Gen- lying comfortably with skin to skin Pulm- no increased work of breathing Fundus: 1 below ambo Ext: 1 beat pedal clonus  A: Postpartum, immediate preE with protein P: - continue mag 24hrs - monitoring Is and Os - antihypertensives PRN - repeat P:C as clonus is new on mag - mag level now

## 2019-12-21 NOTE — H&P (Signed)
OB History & Physical   History of Present Illness:  Chief Complaint:   HPI:  Leslie Trevino is a 24 y.o. G13P1001 female at [redacted]w[redacted]d dated by u/s at 25 weeks.  She presents to L&D for labor.  She reports:  -active fetal movement -no leakage of fluid -bloody show at around 0200 -onset of contractions at 0200 currently every 2-3 minutes  Pregnancy Issues: 1. Hx D&E for partial molar preg 09/2016  Send Placenta to path at delivery 2. Late entry to to prenatal care  initial PNV at 25+6wks 3. Elevated 1hr GTT   151 at [redacted]w[redacted]d   3hr GTT: WNL 92-142-105-84 4. Hypokalemia  K+ supplements given by ER 09/15/19 for K+ 3.0  Recheck 3/1: K+ 3.4  BMP 12/03/19: 3.7 5. Pica  Very hot water or drinks   Maternal Medical History:   Past Medical History:  Diagnosis Date  . History of miscarriage   . Molar pregnancy     No past surgical history on file.  No Known Allergies  Prior to Admission medications   Medication Sig Start Date End Date Taking? Authorizing Provider  b complex vitamins tablet Take 1 tablet by mouth daily.   Yes [provider]  ferrous sulfate 325 (65 FE) MG tablet Take 325 mg by mouth daily with breakfast.   Yes [provider]  Multiple Vitamin (MULTIVITAMIN WITH MINERALS) TABS tablet Take 1 tablet by mouth daily.   Yes [provider]  Vitamin D, Ergocalciferol, (DRISDOL) 1.25 MG (50000 UNIT) CAPS capsule Take 1 capsule by mouth daily. 07/15/19  Yes [provider]  potassium chloride 20 MEQ TBCR Take 20 mEq by mouth daily for 3 days. 09/15/19 09/21/19  Karen Kitchens, NP  Potassium 99 MG TABS Take by mouth.  09/21/19  [provider]     Prenatal care site: [place]   Social History: She  reports that she has quit smoking. Her smoking use included cigarettes. She smoked 0.25 packs per day. She has never used smokeless tobacco. She reports previous alcohol use. She reports that she does not use  drugs.  Family History: family history is not on file.   Review of Systems: A full review of systems was performed and negative except as noted in the HPI.    Physical Exam:  Vital Signs: BP (!) 152/101   Pulse (!) 49   Temp 97.8 F (36.6 C) (Oral)   Resp 20   Ht 5\' 5"  (1.651 m)   Wt 94.8 kg   LMP  (LMP Unknown)   BMI 34.78 kg/m   General:   alert and cooperative  Skin:  normal  Neurologic:    Alert & oriented x 3  Lungs:   nl effort  Heart:   regular rate and rhythm  Abdomen:  soft between UCs, gravid  Extremities: : non-tender, symmetric    No results found for this or any previous visit (from the past 24 hour(s)).  Pertinent Results:  Prenatal Labs: Blood type/Rh A pos  Antibody screen neg  Rubella Immune  Varicella Immune  RPR NR  HBsAg Neg  HIV NR  GC neg  Chlamydia neg  Genetic screening MaterniT21 negative  1 hour GTT 151  3 hour GTT 92-142-105-84  GBS neg   FHT: FHR: 130 bpm, variability: moderate,  accelerations:  Abscent,  decelerations:  Absent Category/reactivity:  Category I TOCO: regular, every 2-3 minutes SVE: Dilation: 3.5 / Effacement (%): 70 / Station: -3, -2  Cephalic by SVE  No results found.   Assessment:  Jerene Yeager is a 24 y.o. G2P1001 female at [redacted]w[redacted]d with labor.   Plan:  1. Admit to Labor & Delivery; consents reviewed and obtained  2. Fetal Well being  - Fetal Tracing: Cat I - GBS neg - Presentation: Vtx confirmed by SVE   3. Routine OB: - Prenatal labs reviewed, as above - Rh pos - CBC & T&S on admit - Clear fluids, IVF  4. Monitoring of Labor -  Contractions external toco in place -  Pelvis proven to 6#12.8 -  Plan for continuous fetal monitoring  -  Maternal pain control as desired: IVPM, regional anesthesia - Anticipate vaginal delivery  5. Post Partum Planning: - Infant feeding: formula - Contraception: condoms Tdap given 10/06/19 Flu declined  Kawan Valladolid, CNM 12/21/2019 7:01 AM

## 2019-12-21 NOTE — OB Triage Note (Signed)
Pt presents to unit c/o ctx every 3 minutes that started around 0200 this am. Pt reports some bright red vaginal bleeding earlier. Pt reports +FM, denies LOF.

## 2019-12-21 NOTE — Progress Notes (Signed)
Post Partum Day 0  Subjective: Elevated blood pressures and proteinuria.  Denies headache, visual changes, or RUQ/epigastric pain.  Objective: BP (!) 148/91   Pulse (!) 59   Temp 97.8 F (36.6 C) (Oral)   Resp 14   Ht 5\' 5"  (1.651 m)   Wt 94.8 kg   LMP 03/26/2019   SpO2 97%   Breastfeeding Unknown   BMI 34.78 kg/m   Vitals with BMI 12/21/2019 12/21/2019 12/21/2019  Height - - -  Weight - - -  BMI - - -  Systolic 093 818 299  Diastolic 91 91 371  Pulse 59 60 63     Physical Exam:  General: alert and cooperative CV: RRR Pulm: nl effort Abdomen: soft, non-tender Uterine Fundus: firm Lochia: appropriate  Results for orders placed or performed during the hospital encounter of 12/21/19 (from the past 24 hour(s))  SARS Coronavirus 2 by RT PCR (hospital order, performed in [place] hospital lab) Nasopharyngeal Nasopharyngeal Swab   Collection Time: 12/21/19  7:04 AM   Specimen: Nasopharyngeal Swab  Result Value Ref Range   SARS Coronavirus 2 NEGATIVE NEGATIVE  CBC   Collection Time: 12/21/19  7:21 AM  Result Value Ref Range   WBC 5.4 4.0 - 10.5 K/uL   RBC 3.60 (L) 3.87 - 5.11 MIL/uL   Hemoglobin 10.1 (L) 12.0 - 15.0 g/dL   HCT 30.7 (L) 36.0 - 46.0 %   MCV 85.3 80.0 - 100.0 fL   MCH 28.1 26.0 - 34.0 pg   MCHC 32.9 30.0 - 36.0 g/dL   RDW 15.6 (H) 11.5 - 15.5 %   Platelets 167 150 - 400 K/uL   nRBC 0.4 (H) 0.0 - 0.2 %  Type and screen [place]   Collection Time: 12/21/19  7:21 AM  Result Value Ref Range   ABO/RH(D) A POS    Antibody Screen NEG    Sample Expiration      12/24/2019,2359 Performed at [facility], [address] [place], [place] [address]   ABO/Rh   Collection Time: 12/21/19  8:15 AM  Result Value Ref Range   ABO/RH(D)      A POS Performed at [facility], [address] [place], [place] [address]   CBC with Differential/Platelet   Collection Time: 12/21/19 12:26 PM  Result Value Ref Range   WBC  11.8 (H) 4.0 - 10.5 K/uL   RBC 3.69 (L) 3.87 - 5.11 MIL/uL   Hemoglobin 10.4 (L) 12.0 - 15.0 g/dL   HCT 31.8 (L) 36.0 - 46.0 %   MCV 86.2 80.0 - 100.0 fL   MCH 28.2 26.0 - 34.0 pg   MCHC 32.7 30.0 - 36.0 g/dL   RDW 15.5 11.5 - 15.5 %   Platelets 184 150 - 400 K/uL   nRBC 0.0 0.0 - 0.2 %   Neutrophils Relative % 85 %   Neutro Abs 10.0 (H) 1.7 - 7.7 K/uL   Lymphocytes Relative 9 %   Lymphs Abs 1.1 0.7 - 4.0 K/uL   Monocytes Relative 5 %   Monocytes Absolute 0.6 0.1 - 1.0 K/uL   Eosinophils Relative 0 %   Eosinophils Absolute 0.0 0.0 - 0.5 K/uL   Basophils Relative 0 %   Basophils Absolute 0.0 0.0 - 0.1 K/uL   Immature Granulocytes 1 %   Abs Immature Granulocytes 0.06 0.00 - 0.07 K/uL  Comprehensive metabolic panel   Collection Time: 12/21/19 12:26 PM  Result Value Ref Range   Sodium  138 135 - 145 mmol/L   Potassium 3.9 3.5 - 5.1 mmol/L   Chloride 109 98 - 111 mmol/L   CO2 20 (L) 22 - 32 mmol/L   Glucose, Bld 101 (H) 70 - 99 mg/dL   BUN <5 (L) 6 - 20 mg/dL   Creatinine, Ser 6.52 0.44 - 1.00 mg/dL   Calcium 8.4 (L) 8.9 - 10.3 mg/dL   Total Protein 6.4 (L) 6.5 - 8.1 g/dL   Albumin 2.5 (L) 3.5 - 5.0 g/dL   AST 32 15 - 41 U/L   ALT 22 0 - 44 U/L   Alkaline Phosphatase 212 (H) 38 - 126 U/L   Total Bilirubin 1.1 0.3 - 1.2 mg/dL   GFR calc non Af Amer >60 >60 mL/min   GFR calc Af Amer >60 >60 mL/min   Anion gap 9 5 - 15  Protein / creatinine ratio, urine   Collection Time: 12/21/19 12:34 PM  Result Value Ref Range   Creatinine, Urine 123 mg/dL   Total Protein, Urine 119 mg/dL   Protein Creatinine Ratio 0.97 (H) 0.00 - 0.15 mg/mg[Cre]  ] Assessment/Plan: 24 y.o. [id number] postpartum day # 0  - Initiate Mag Sulfate 6g load then 2g/hr - Labetalol ordered for elevated BPs  -Continue routine postpartum care    LOS: 0 days   Kimber Esterly, CNM 12/21/2019, 2:09 PM

## 2019-12-22 LAB — COMPREHENSIVE METABOLIC PANEL
ALT: 22 U/L (ref 0–44)
AST: 33 U/L (ref 15–41)
Albumin: 2.2 g/dL — ABNORMAL LOW (ref 3.5–5.0)
Alkaline Phosphatase: 159 U/L — ABNORMAL HIGH (ref 38–126)
Anion gap: 7 (ref 5–15)
BUN: <5 mg/dL — ABNORMAL LOW (ref 6–20)
CO2: 23 mmol/L (ref 22–32)
Calcium: 7.2 mg/dL — ABNORMAL LOW (ref 8.9–10.3)
Chloride: 108 mmol/L (ref 98–111)
Creatinine, Ser: 0.59 mg/dL (ref 0.44–1.00)
GFR calc Af Amer: >60 mL/min (ref >60)
GFR calc non Af Amer: >60 mL/min (ref >60)
Glucose, Bld: 77 mg/dL (ref 70–99)
Potassium: 3.4 mmol/L — ABNORMAL LOW (ref 3.5–5.1)
Sodium: 138 mmol/L (ref 135–145)
Total Bilirubin: 0.6 mg/dL (ref 0.3–1.2)
Total Protein: 5.4 g/dL — ABNORMAL LOW (ref 6.5–8.1)

## 2019-12-22 LAB — SYPHILIS: RPR W/REFLEX TO RPR TITER AND TREPONEMAL ANTIBODIES, TRADITIONAL SCREENING AND DIAGNOSIS ALGORITHM: RPR Ser Ql: NONREACTIVE

## 2019-12-22 LAB — CBC
HCT: 27.4 % — ABNORMAL LOW (ref 36.0–46.0)
Hemoglobin: 9.4 g/dL — ABNORMAL LOW (ref 12.0–15.0)
MCH: 28.3 pg (ref 26.0–34.0)
MCHC: 34.3 g/dL (ref 30.0–36.0)
MCV: 82.5 fL (ref 80.0–100.0)
Platelets: 198 10*3/uL (ref 150–400)
RBC: 3.32 MIL/uL — ABNORMAL LOW (ref 3.87–5.11)
RDW: 15.5 % (ref 11.5–15.5)
WBC: 8.7 10*3/uL (ref 4.0–10.5)
nRBC: 0 % (ref 0.0–0.2)

## 2019-12-22 LAB — PROTEIN / CREATININE RATIO, URINE
Creatinine, Urine: 14 mg/dL
Total Protein, Urine: <6 mg/dL

## 2019-12-22 LAB — MAGNESIUM: Magnesium: 4.4 mg/dL — ABNORMAL HIGH (ref 1.7–2.4)

## 2019-12-22 MED ORDER — SIMETHICONE 80 MG PO CHEW: 160.0000 mg | CHEWABLE_TABLET | ORAL | Status: DC | PRN

## 2019-12-22 MED ORDER — NIFEDIPINE ER OSMOTIC RELEASE 30 MG PO TB24
30.0000 mg | ORAL_TABLET | Freq: Every day | ORAL | Status: DC
  Administered 2019-12-22 – 2019-12-23 (×2): 30 mg via ORAL
  Filled 2019-12-22 (×2): qty 1

## 2019-12-22 NOTE — Anesthesia Postprocedure Evaluation (Signed)
Anesthesia Post Note  Patient: Leslie Trevino  Procedure(s) Performed: AN AD HOC LABOR EPIDURAL  Patient location during evaluation: Mother Baby Anesthesia Type: Epidural Level of consciousness: awake and alert Pain management: pain level controlled Vital Signs Assessment: post-procedure vital signs reviewed and stable Respiratory status: spontaneous breathing, nonlabored ventilation and respiratory function stable Cardiovascular status: stable Postop Assessment: no headache, no backache and epidural receding Anesthetic complications: no     Last Vitals:  Vitals:   12/22/19 0734 12/22/19 0833  BP: (!) 141/96 (!) 144/86  Pulse: 90 94  Resp: 16 14  Temp: 36.7 C   SpO2:      Last Pain:  Vitals:   12/22/19 0833  TempSrc:   PainSc: 0-No pain                 Brayden Betters Lawerance Cruel

## 2019-12-22 NOTE — Progress Notes (Signed)
Post Partum Day 1  Subjective: Doing well, no concerns. Ambulating without difficulty, pain managed with PO meds, tolerating regular diet, and voiding without difficulty.   No fever/chills, chest pain, shortness of breath, nausea/vomiting, or leg pain. No nipple or breast pain. No headache, visual changes, or RUQ/epigastric pain.  Objective: BP (!) 141/96 (BP Location: Right Arm)   Pulse 90   Temp 98 F (36.7 C) (Oral)   Resp 16   Ht 5\' 5"  (1.651 m)   Wt 94.8 kg   LMP 03/26/2019   SpO2 99%   Breastfeeding Unknown   BMI 34.78 kg/m    Physical Exam:  General: alert, cooperative, appears stated age and no distress Breasts: soft/nontender CV: RRR Pulm: nl effort, CTABL Abdomen: soft, non-tender, active bowel sounds Uterine Fundus: firm Lochia: appropriate DVT Evaluation: No evidence of DVT seen on physical exam. No cords or calf tenderness. No significant calf/ankle edema.  Recent Labs    12/21/19 1226 12/22/19 0536  HGB 10.4* 9.4*  HCT 31.8* 27.4*  WBC 11.8* 8.7  PLT 184 198    Assessment/Plan: 24 y.o. G2P2002 postpartum day # 1  -Continue routine postpartum care -Encouraged snug fitting bra, cold application, Tylenol PRN, and cabbage leaves for engorgement for formula feeding  -Acute blood loss anemia - hemodynamically stable and asymptomatic; continue PO ferrous sulfate BID with stool softeners  -Immunization status: all immunizations up to date -Preeclampsia: On magnesium sulfate since yesterday at 1429. Blood pressures normal to mild range. If mild range blood pressures persist, plan to start antihypertensives.   Disposition: Continue inpatient postpartum care    LOS: 1 day   24, CNM 12/22/2019, 8:02 AM   ----- 02/21/2020 Certified Nurse Midwife [facility]

## 2019-12-23 LAB — SURGICAL PATHOLOGY

## 2019-12-23 MED ORDER — NIFEDIPINE ER OSMOTIC RELEASE 30 MG PO TB24
30.0000 mg | ORAL_TABLET | Freq: Once | ORAL | Status: AC
  Administered 2019-12-23: 30 mg via ORAL
  Filled 2019-12-23: qty 1

## 2019-12-23 MED ORDER — NIFEDIPINE ER OSMOTIC RELEASE 30 MG PO TB24
60.0000 mg | ORAL_TABLET | Freq: Every day | ORAL | Status: DC
  Administered 2019-12-24: 60 mg via ORAL
  Filled 2019-12-23: qty 2

## 2019-12-23 NOTE — Progress Notes (Signed)
Post Partum Day 2 Subjective: Doing well, no complaints.  Tolerating regular diet, pain with PO meds, voiding and ambulating without difficulty.  No CP SOB Fever,Chills, N/V or leg pain; denies nipple or breast pain, no HA change of vision, RUQ/epigastric pain but feels "fuzzy or foggy".   Objective: BP 125/77 (BP Location: Left Arm)   Pulse 83   Temp 98.8 F (37.1 C) (Oral)   Resp 18   Ht 5\' 5"  (1.651 m)   Wt 94.8 kg   LMP 03/26/2019   SpO2 100%   Breastfeeding Unknown   BMI 34.78 kg/m    Vitals with BMI 12/23/2019 12/23/2019 12/23/2019  Height - - -  Weight - - -  BMI - - -  Systolic 125 148 02/22/2020  Diastolic 77 95 82  Pulse 83 86 77      Physical Exam:  General: NAD CV: RRR Pulm: nl effort, CTABL Abdomen: soft, NT, BS x 4 Perineum: minimal edema, intact Lochia: small Uterine Fundus: fundus firm and 2 fb below umbilicus DVT Evaluation: no cords, ttp LEs   Recent Labs    12/21/19 1226 12/22/19 0536  HGB 10.4* 9.4*  HCT 31.8* 27.4*  WBC 11.8* 8.7  PLT 184 198    Assessment/Plan: 23 y.o. G2P2002 postpartum day # 2  - Continue routine PP care - elevated BP this am approx 12hrs after initial dose of Procardia, received 2nd dose today at 1130, discussed with Dr 02/21/20, will give additional 30mg  now and plan to increase dose to 60mg  XL tomorrow. Remain inpatient overnight to monitor BP.  - encouraged snug fitting bra and cabbage leaves for bottlefeeding.  - undecided on contraception, will discuss at [facility] visit. Pt reports pregnancy with Depo.  - Acute blood loss anemia - hemodynamically stable and asymptomatic; start po ferrous sulfate BID with stool softeners  - Immunization status: all Imms up to date    Disposition: Does not desire Dc home today.     , CNM 12/23/2019  1:39 PM

## 2019-12-24 MED ORDER — NIFEDIPINE ER 60 MG PO TB24: 60.0000 mg | ORAL_TABLET | Freq: Every day | ORAL | 3 refills | Status: DC

## 2019-12-24 MED ORDER — BLOOD PRESSURE KIT
1.0000 | PACK | Freq: Every day | 0 refills | Status: DC
Start: 2019-12-24 — End: 2020-01-12

## 2019-12-24 MED ORDER — ACETAMINOPHEN 325 MG PO TABS: 650.0000 mg | ORAL_TABLET | ORAL | Status: DC | PRN

## 2019-12-24 MED ORDER — IBUPROFEN 600 MG PO TABS: 600.0000 mg | ORAL_TABLET | Freq: Four times a day (QID) | ORAL | 3 refills | Status: DC

## 2019-12-24 NOTE — Progress Notes (Signed)
Discharge order received from doctor. Reviewed discharge instructions including postpartum hypertension signs and symptoms and prescriptions with patient and answered all questions. Follow up appointment given. Patient verbalized understanding. ID bands checked. Patient discharged home with infant via wheelchair by nursing/auxillary.

## 2019-12-24 NOTE — Discharge Instructions (Signed)

## 2020-01-12 ENCOUNTER — Other Ambulatory Visit: Payer: Self-pay

## 2020-01-12 ENCOUNTER — Ambulatory Visit (INDEPENDENT_AMBULATORY_CARE_PROVIDER_SITE_OTHER)
Admission: EM | Admit: 2020-01-12 | Discharge: 2020-01-12 | Disposition: A | Discharge status: Home / Self Care | Payer: Medicaid Other | Source: Home / Self Care | Attending: Family Medicine | Admitting: Family Medicine

## 2020-01-12 ENCOUNTER — Emergency Department: Payer: Medicaid Other

## 2020-01-12 ENCOUNTER — Encounter: Payer: Self-pay | Admitting: Emergency Medicine

## 2020-01-12 ENCOUNTER — Inpatient Hospital Stay
Admission: EM | Admit: 2020-01-12 | Discharge: 2020-01-14 | DRG: 603 | Disposition: A | Discharge status: Home / Self Care | Payer: Medicaid Other | Attending: Internal Medicine | Admitting: Internal Medicine

## 2020-01-12 DIAGNOSIS — L0291 Cutaneous abscess, unspecified: Secondary | ICD-10-CM | POA: Diagnosis present

## 2020-01-12 DIAGNOSIS — A419 Sepsis, unspecified organism: Secondary | ICD-10-CM

## 2020-01-12 DIAGNOSIS — L03317 Cellulitis of buttock: Secondary | ICD-10-CM | POA: Diagnosis present

## 2020-01-12 DIAGNOSIS — Z87891 Personal history of nicotine dependence: Secondary | ICD-10-CM

## 2020-01-12 DIAGNOSIS — Z79899 Other long term (current) drug therapy: Secondary | ICD-10-CM

## 2020-01-12 DIAGNOSIS — E876 Hypokalemia: Secondary | ICD-10-CM | POA: Diagnosis present

## 2020-01-12 DIAGNOSIS — R778 Other specified abnormalities of plasma proteins: Secondary | ICD-10-CM | POA: Diagnosis present

## 2020-01-12 DIAGNOSIS — L03315 Cellulitis of perineum: Secondary | ICD-10-CM

## 2020-01-12 DIAGNOSIS — Z20822 Contact with and (suspected) exposure to covid-19: Secondary | ICD-10-CM | POA: Diagnosis present

## 2020-01-12 DIAGNOSIS — L0231 Cutaneous abscess of buttock: Principal | ICD-10-CM | POA: Diagnosis present

## 2020-01-12 DIAGNOSIS — D72829 Elevated white blood cell count, unspecified: Secondary | ICD-10-CM

## 2020-01-12 LAB — C-REACTIVE PROTEIN: CRP: 3.6 mg/dL — ABNORMAL HIGH (ref <1.0)

## 2020-01-12 LAB — URINALYSIS, COMPLETE (UACMP) WITH MICROSCOPIC
Bilirubin Urine: NEGATIVE
Glucose, UA: NEGATIVE mg/dL
Ketones, ur: 5 mg/dL — AB
Nitrite: NEGATIVE
Protein, ur: NEGATIVE mg/dL
Specific Gravity, Urine: 1.018 (ref 1.005–1.030)
pH: 6 (ref 5.0–8.0)

## 2020-01-12 LAB — PROCALCITONIN: Procalcitonin: <0.1 ng/mL

## 2020-01-12 LAB — BASIC METABOLIC PANEL
Anion gap: 9 (ref 5–15)
BUN: 11 mg/dL (ref 6–20)
CO2: 18 mmol/L — ABNORMAL LOW (ref 22–32)
Calcium: 9 mg/dL (ref 8.9–10.3)
Chloride: 108 mmol/L (ref 98–111)
Creatinine, Ser: 0.61 mg/dL (ref 0.44–1.00)
GFR calc Af Amer: >60 mL/min (ref >60)
GFR calc non Af Amer: >60 mL/min (ref >60)
Glucose, Bld: 77 mg/dL (ref 70–99)
Potassium: 3.6 mmol/L (ref 3.5–5.1)
Sodium: 135 mmol/L (ref 135–145)

## 2020-01-12 LAB — CBC WITH DIFFERENTIAL/PLATELET
Abs Immature Granulocytes: 0.05 10*3/uL (ref 0.00–0.07)
Basophils Absolute: 0 10*3/uL (ref 0.0–0.1)
Basophils Relative: 0 %
Eosinophils Absolute: 0 10*3/uL (ref 0.0–0.5)
Eosinophils Relative: 0 %
HCT: 36.2 % (ref 36.0–46.0)
Hemoglobin: 12 g/dL (ref 12.0–15.0)
Immature Granulocytes: 0 %
Lymphocytes Relative: 16 %
Lymphs Abs: 2.1 10*3/uL (ref 0.7–4.0)
MCH: 27.6 pg (ref 26.0–34.0)
MCHC: 33.1 g/dL (ref 30.0–36.0)
MCV: 83.4 fL (ref 80.0–100.0)
Monocytes Absolute: 0.7 10*3/uL (ref 0.1–1.0)
Monocytes Relative: 6 %
Neutro Abs: 9.7 10*3/uL — ABNORMAL HIGH (ref 1.7–7.7)
Neutrophils Relative %: 78 %
Platelets: 308 10*3/uL (ref 150–400)
RBC: 4.34 MIL/uL (ref 3.87–5.11)
RDW: 16.3 % — ABNORMAL HIGH (ref 11.5–15.5)
WBC: 12.6 10*3/uL — ABNORMAL HIGH (ref 4.0–10.5)
nRBC: 0 % (ref 0.0–0.2)

## 2020-01-12 LAB — SARS CORONAVIRUS 2 BY RT PCR (HOSPITAL ORDER, PERFORMED IN ~~LOC~~ HOSPITAL LAB): SARS Coronavirus 2: NEGATIVE

## 2020-01-12 LAB — PROTIME-INR
INR: 1.1 (ref 0.8–1.2)
Prothrombin Time: 13.4 seconds (ref 11.4–15.2)

## 2020-01-12 LAB — HIV ANTIBODY (ROUTINE TESTING W REFLEX): HIV Screen 4th Generation wRfx: NONREACTIVE

## 2020-01-12 LAB — LACTIC ACID, PLASMA: Lactic Acid, Venous: 0.6 mmol/L (ref 0.5–1.9)

## 2020-01-12 LAB — APTT: aPTT: 34 seconds (ref 24–36)

## 2020-01-12 LAB — SEDIMENTATION RATE: Sed Rate: 63 mm/hr — ABNORMAL HIGH (ref 0–20)

## 2020-01-12 MED ORDER — SODIUM CHLORIDE 0.9 % IV BOLUS
1000.0000 mL | Freq: Once | INTRAVENOUS | Status: AC
  Administered 2020-01-12: 1000 mL via INTRAVENOUS

## 2020-01-12 MED ORDER — SENNOSIDES-DOCUSATE SODIUM 8.6-50 MG PO TABS
1.0000 | ORAL_TABLET | Freq: Every evening | ORAL | Status: DC | PRN
  Administered 2020-01-12: 1 via ORAL
  Filled 2020-01-12: qty 1

## 2020-01-12 MED ORDER — HYDROCODONE-ACETAMINOPHEN 5-325 MG PO TABS
1.0000 | ORAL_TABLET | Freq: Once | ORAL | Status: AC
  Administered 2020-01-12: 1 via ORAL
  Filled 2020-01-12: qty 1

## 2020-01-12 MED ORDER — ONDANSETRON HCL 4 MG/2ML IJ SOLN: 4.0000 mg | Freq: Three times a day (TID) | INTRAMUSCULAR | Status: DC | PRN

## 2020-01-12 MED ORDER — ENOXAPARIN SODIUM 40 MG/0.4ML ~~LOC~~ SOLN
40.0000 mg | SUBCUTANEOUS | Status: DC
  Administered 2020-01-12 – 2020-01-13 (×2): 40 mg via SUBCUTANEOUS
  Filled 2020-01-12 (×2): qty 0.4

## 2020-01-12 MED ORDER — IOHEXOL 300 MG/ML  SOLN
100.0000 mL | Freq: Once | INTRAMUSCULAR | Status: AC | PRN
  Administered 2020-01-12: 100 mL via INTRAVENOUS
  Filled 2020-01-12: qty 100

## 2020-01-12 MED ORDER — SULFAMETHOXAZOLE-TRIMETHOPRIM 800-160 MG PO TABS
1.0000 | ORAL_TABLET | Freq: Once | ORAL | Status: DC
  Filled 2020-01-12: qty 1

## 2020-01-12 MED ORDER — LIDOCAINE-EPINEPHRINE (PF) 1 %-1:200000 IJ SOLN
30.0000 mL | Freq: Once | INTRAMUSCULAR | Status: DC
  Filled 2020-01-12: qty 30

## 2020-01-12 MED ORDER — SODIUM CHLORIDE 0.9 % IV SOLN: INTRAVENOUS | Status: DC

## 2020-01-12 MED ORDER — METRONIDAZOLE IN NACL 5-0.79 MG/ML-% IV SOLN
500.0000 mg | Freq: Three times a day (TID) | INTRAVENOUS | Status: DC
  Administered 2020-01-12 – 2020-01-13 (×3): 500 mg via INTRAVENOUS
  Filled 2020-01-12 (×5): qty 100

## 2020-01-12 MED ORDER — VANCOMYCIN HCL 1500 MG/300ML IV SOLN
1500.0000 mg | Freq: Two times a day (BID) | INTRAVENOUS | Status: DC
  Administered 2020-01-12 – 2020-01-13 (×3): 1500 mg via INTRAVENOUS
  Filled 2020-01-12 (×5): qty 300

## 2020-01-12 MED ORDER — MORPHINE SULFATE (PF) 2 MG/ML IV SOLN
2.0000 mg | INTRAVENOUS | Status: DC | PRN
  Administered 2020-01-12: 2 mg via INTRAVENOUS
  Filled 2020-01-12: qty 1

## 2020-01-12 MED ORDER — VANCOMYCIN HCL 2000 MG/400ML IV SOLN
2000.0000 mg | Freq: Once | INTRAVENOUS | Status: AC
  Administered 2020-01-12: 2000 mg via INTRAVENOUS
  Filled 2020-01-12: qty 400

## 2020-01-12 MED ORDER — OXYCODONE-ACETAMINOPHEN 5-325 MG PO TABS
1.0000 | ORAL_TABLET | ORAL | Status: DC | PRN
  Administered 2020-01-12 (×2): 1 via ORAL
  Filled 2020-01-12 (×2): qty 1

## 2020-01-12 MED ORDER — ACETAMINOPHEN 325 MG PO TABS
650.0000 mg | ORAL_TABLET | Freq: Four times a day (QID) | ORAL | Status: DC | PRN
  Filled 2020-01-12: qty 2

## 2020-01-12 NOTE — ED Triage Notes (Signed)
Pt c/o abscess, red raised area to the left upper thigh for the past 2 weeks.

## 2020-01-12 NOTE — ED Provider Notes (Signed)
[facility] Emergency Department Provider Note ____________________________________________  Time seen: 91  I have reviewed the triage vital signs and the nursing notes.  HISTORY  Chief Complaint  Abscess  HPI Leslie Trevino is a 24 y.o. female presents her self to the ED from [facility] urgent care, for evaluation of a skin abscess.  Patient describes an area to the left infragluteal fold, has been present for the last  2 weeks.  The area apparently is been worsening over that time.  She presented to [facility] urgent care for evaluation management, and was felt that an ED referral was necessary for further management.  Patient denies any interim fever, chills, or sweats.  She also denies any spontaneous purulent drainage.  Patient is 3 weeks postpartum from a spontaneous vaginal delivery that did require suture repair of a first-degree perineal tear.   Past Medical History:  Diagnosis Date  . History of miscarriage   . Molar pregnancy     Patient Active Problem List   Diagnosis Date Noted  . Sepsis ([place]) 01/12/2020  . Cellulitis of left buttock 01/12/2020  . NSVD (normal spontaneous vaginal delivery) 12/21/2019  . Preeclampsia in postpartum period 12/21/2019    Past Surgical History:  Procedure Laterality Date  . NO PAST SURGERIES      Prior to Admission medications   Medication Sig Start Date End Date Taking? Authorizing Provider  acetaminophen (TYLENOL) 325 MG tablet Take 2 tablets (650 mg total) by mouth every 4 (four) hours as needed (for pain scale < 4). 12/24/19   Minda Meo, CNM  b complex vitamins tablet Take 1 tablet by mouth daily.    [provider]  Blood Pressure KIT 1 kit by Does not apply route daily. Check blood pressure once daily.  Call provider for blood pressures 160/110 or greater. 12/24/19   Minda Meo, CNM  ferrous sulfate 325 (65 FE) MG tablet Take 325 mg by mouth daily with breakfast.  01/12/20  [provider]   NIFEdipine (ADALAT CC) 60 MG 24 hr tablet Take 1 tablet (60 mg total) by mouth daily. 12/25/19 01/12/20  Minda Meo, CNM  Potassium 99 MG TABS Take by mouth.  09/21/19  [provider]    Allergies Patient has no known allergies.  Family History  Problem Relation Age of Onset  . Healthy Mother   . Healthy Father     Social History Social History   Tobacco Use  . Smoking status: Former Smoker    Packs/day: 0.25    Types: Cigarettes  . Smokeless tobacco: Never Used  Vaping Use  . Vaping Use: Never used  Substance Use Topics  . Alcohol use: Not Currently  . Drug use: Never    Review of Systems  Constitutional: Negative for fever. Respiratory: Negative for shortness of breath. Gastrointestinal: Negative for abdominal pain, vomiting and diarrhea. Genitourinary: Negative for dysuria. Musculoskeletal: Negative for back pain. Skin: Negative for rash.  Skin abscess as above. Neurological: Negative for headaches, focal weakness or numbness. ____________________________________________  PHYSICAL EXAM:  VITAL SIGNS: ED Triage Vitals [01/12/20 0923]  Enc Vitals Group     BP 119/86     Pulse Rate (!) 117     Resp 17     Temp 97.9 F (36.6 C)     Temp Source Oral     SpO2 100 %     Weight 172 lb 6.4 oz (78.2 kg)     Height 5' 5" (1.651 m)  Head Circumference      Peak Flow      Pain Score 10     Pain Loc      Pain Edu?      Excl. in [place]?     Constitutional: Alert and oriented. Well appearing and in no distress. Head: Normocephalic and atraumatic. Eyes: Conjunctivae are normal. Normal extraocular movements Cardiovascular: Normal rate, regular rhythm. Normal distal pulses. Respiratory: Normal respiratory effort. No wheezes/rales/rhonchi. Gastrointestinal: Soft and nontender. No distention. Musculoskeletal: Nontender with normal range of motion in all extremities.  Neurologic:  Normal gait without ataxia. Normal speech and language. No gross focal  neurologic deficits are appreciated. Skin:  Skin is warm, dry and intact. No rash noted.  Left buttocks region with a large area of firm indurated erythematous skin.  No obvious pointing, fluctuance, or purulence is noted.  The cellulitis extends medially towards the gluteal cleft, and inferiorly towards the perineum and labia. ____________________________________________   LABORATORY   Labs Reviewed  BASIC METABOLIC PANEL - Abnormal; Notable for the following components:      Result Value   CO2 18 (*)    All other components within normal limits  CBC WITH DIFFERENTIAL/PLATELET - Abnormal; Notable for the following components:   WBC 12.6 (*)    RDW 16.3 (*)    Neutro Abs 9.7 (*)    All other components within normal limits  URINALYSIS, COMPLETE (UACMP) WITH MICROSCOPIC - Abnormal; Notable for the following components:   Color, Urine YELLOW (*)    APPearance CLEAR (*)    Hgb urine dipstick SMALL (*)    Ketones, ur 5 (*)    Leukocytes,Ua TRACE (*)    Bacteria, UA MANY (*)    All other components within normal limits  CULTURE, BLOOD (ROUTINE X 2)  CULTURE, BLOOD (ROUTINE X 2)  SARS CORONAVIRUS 2 BY RT PCR (HOSPITAL ORDER, [place])  LACTIC ACID, PLASMA  LACTIC ACID, PLASMA  PROTIME-INR  APTT  PROCALCITONIN  SEDIMENTATION RATE  C-REACTIVE PROTEIN  HIV ANTIBODY (ROUTINE TESTING W REFLEX)  POC URINE PREG, ED  ____________________________________________  RADIOLOGY  CT Pelvis w/ CM  IMPRESSION: 1. Inflammatory changes are seen involving the subcutaneous tissues of the left buttocks region medially most consistent with cellulitis. However, no definite fluid collection or abscess is noted. 2. Enlarged uterus is noted consistent with recently postpartum status. ____________________________________________  PROCEDURES  Norco 5-325 mg PO  Procedures ____________________________________________  INITIAL IMPRESSION / ASSESSMENT AND PLAN / ED  COURSE  ----------------------------------------- 1:38 PM on 01/12/2020 -----------------------------------------  S/W Dr. Blaine Hamper: he is agreeable with plan to admit for IV antibiotics. Patient understands and is amenable to the plan of care.   Patient with ED evaluation of an increasing area of redness, firmness, and pain to the left buttocks region consistent with a cellulitis, concerning for possible abscess.  She was evaluated and found to have an elevated white blood Count at 12.6 with a left shift.  Her CT scan however revealed a large cellulitic region without focal abscess.  Given the patient's pain and disability related to the condition, I felt it was reasonable to consider admission for continued IV antibiotic therapy.  Monie Shere was evaluated in Emergency Department on 01/12/2020 for the symptoms described in the history of present illness. She was evaluated in the context of the global COVID-19 pandemic, which necessitated consideration that the patient might be at risk for infection with the SARS-CoV-2 virus that causes COVID-19. Institutional protocols and algorithms  that pertain to the evaluation of patients at risk for COVID-19 are in a state of rapid change based on information released by regulatory bodies including the CDC and federal and state organizations. These policies and algorithms were followed during the patient's care in the ED. ____________________________________________  FINAL CLINICAL IMPRESSION(S) / ED DIAGNOSES  Final diagnoses:  Cellulitis and abscess of buttock      Carmie End, Dannielle Karvonen, PA-C 01/12/20 1341    Vanessa Dent, MD 01/13/20 [phone]

## 2020-01-12 NOTE — ED Provider Notes (Signed)
[facility]    CSN: [id number] Arrival date & time: 01/12/20  3419      History   Chief Complaint Chief Complaint  Patient presents with  . Abscess   HPI  24 year old female presents with the above complaint.  Patient reports that she has an abscess in the genital area.  She states that she has had this for the past 2 weeks.  She has not informed her OB/GYN.  Appears to be worsening.  She reports some drainage.  Rates her pain as 10/10 in severity.  No documented fever.  No relieving factors.  No other associated symptoms.  Past Medical History:  Diagnosis Date  . History of miscarriage   . Molar pregnancy     Patient Active Problem List   Diagnosis Date Noted  . NSVD (normal spontaneous vaginal delivery) 12/21/2019  . Preeclampsia in postpartum period 12/21/2019    Past Surgical History:  Procedure Laterality Date  . NO PAST SURGERIES      OB History    Gravida  2   Para  2   Term  2   Preterm      AB      Living  2     SAB      TAB      Ectopic      Multiple  0   Live Births  1            Home Medications    Prior to Admission medications   Medication Sig Start Date End Date Taking? Authorizing Provider  acetaminophen (TYLENOL) 325 MG tablet Take 2 tablets (650 mg total) by mouth every 4 (four) hours as needed (for pain scale < 4). 12/24/19  Yes Minda Meo, CNM  b complex vitamins tablet Take 1 tablet by mouth daily.    [provider]  Blood Pressure KIT 1 kit by Does not apply route daily. Check blood pressure once daily.  Call provider for blood pressures 160/110 or greater. 12/24/19   Minda Meo, CNM  potassium chloride 20 MEQ TBCR Take 20 mEq by mouth daily for 3 days. 09/15/19 09/21/19  Karen Kitchens, NP  ferrous sulfate 325 (65 FE) MG tablet Take 325 mg by mouth daily with breakfast.  01/12/20  [provider]  NIFEdipine (ADALAT CC) 60 MG 24 hr tablet Take 1 tablet (60 mg total) by mouth daily. 12/25/19  01/12/20  Minda Meo, CNM  Potassium 99 MG TABS Take by mouth.  09/21/19  [provider]    Family History Family History  Problem Relation Age of Onset  . Healthy Mother   . Healthy Father     Social History Social History   Tobacco Use  . Smoking status: Former Smoker    Packs/day: 0.25    Types: Cigarettes  . Smokeless tobacco: Never Used  Vaping Use  . Vaping Use: Never used  Substance Use Topics  . Alcohol use: Not Currently  . Drug use: Never     Allergies   Patient has no known allergies.   Review of Systems Review of Systems  Constitutional: Negative for fever.  Skin:       Abscess.   Physical Exam Triage Vital Signs ED Triage Vitals  Enc Vitals Group     BP 01/12/20 0841 108/67     Pulse Rate 01/12/20 0841 84     Resp 01/12/20 0841 18     Temp 01/12/20 0841 98 F (36.7 C)  Temp Source 01/12/20 0841 Oral     SpO2 01/12/20 0841 100 %     Weight 01/12/20 0836 172 lb 6.4 oz (78.2 kg)     Height 01/12/20 0836 _0  (1.651 m)     Head Circumference --      Peak Flow --      Pain Score 01/12/20 0836 10     Pain Loc --      Pain Edu? --      Excl. in [place]? --    Updated Vital Signs BP 108/67 (BP Location: Left Arm)   Pulse 84   Temp 98 F (36.7 C) (Oral)   Resp 18   Ht _1  (1.651 m)   Wt 78.2 kg   SpO2 100%   Breastfeeding No   BMI 28.69 kg/m   Visual Acuity Right Eye Distance:   Left Eye Distance:   Bilateral Distance:    Right Eye Near:   Left Eye Near:    Bilateral Near:     Physical Exam Vitals and nursing note reviewed.  Constitutional:      General: She is not in acute distress.    Appearance: Normal appearance. She is not ill-appearing.  HENT:     Head: Normocephalic and atraumatic.  Eyes:     General:        Right eye: No discharge.        Left eye: No discharge.     Conjunctiva/sclera: Conjunctivae normal.  Pulmonary:     Effort: Pulmonary effort is normal. No respiratory distress.   Genitourinary:      Comments: Large area of erythema, fluctuance, and induration noted at the level location.  Extends upwards around the labia.  Exquisitely tender to palpation. Neurological:     Mental Status: She is alert.  Psychiatric:        Mood and Affect: Mood normal.        Behavior: Behavior normal.    UC Treatments / Results  Labs (all labs ordered are listed, but only abnormal results are displayed) Labs Reviewed - No data to display  EKG   Radiology No results found.  Procedures Procedures (including critical care time)  Medications Ordered in UC Medications - No data to display  Initial Impression / Assessment and Plan / UC Course  I have reviewed the triage vital signs and the nursing notes.  Pertinent labs & imaging results that were available during my care of the patient were reviewed by me and considered in my medical decision making (see chart for details).    24 year old female presents with an extensive abscess.  Given exam findings and location, I felt that it would be best that patient go to the ER for further evaluation and management.  May need imaging and/or surgical consultation.  Patient in agreement.  She is going [facility] the ER at [facility].  Final Clinical Impressions(s) / UC Diagnoses   Final diagnoses:  Abscess     Discharge Instructions     Please go to the ER.  This abscess if very large and may need further evaluation with imaging or surgical consultation.  Take care  Dr. Lacinda Axon    ED Prescriptions    None     PDMP not reviewed this encounter.   Thersa Salt [place], [place] 01/12/20 [phone]

## 2020-01-12 NOTE — Discharge Instructions (Addendum)
Please go to the ER.  This abscess if very large and may need further evaluation with imaging or surgical consultation.  Take care  Dr. Adriana Simas

## 2020-01-12 NOTE — ED Notes (Addendum)
See triage note  Presents from [facility] with possible abscess area  States she noticed area to left groin about 2 weeks ago  Has gotten larger and more painful    Also states she is PP 2 weeks

## 2020-01-12 NOTE — Consult Note (Signed)
Pharmacy Antibiotic Note  Leslie Trevino is a 24 y.o. female admitted on 01/12/2020 with cellulitis.  Pharmacy has been consulted for vancomycin dosing. Patient gave birth 3 weeks ago, no longer pregnant, will continue with vancoReady.   Plan: Will give vancomycin 2000 mg x 1 followed by vancomycin 1500 mg q12H per [place] nomogram. Plan to draw levels in the next 2-3 days.   Height: 5\' 5"  (165.1 cm) Weight: 78.2 kg (172 lb 6.4 oz) IBW/kg (Calculated) : 57  Temp (24hrs), Avg:98 F (36.7 C), Min:97.9 F (36.6 C), Max:98 F (36.7 C)  Recent Labs  Lab 01/12/20 1052  WBC 12.6*  CREATININE 0.61    Estimated Creatinine Clearance: 112.1 mL/min (by C-G formula based on SCr of 0.61 mg/dL).    No Known Allergies   Microbiology results: None  Thank you for allowing pharmacy to be a part of this patient's care.  01/14/20, PharmD, BCPS 01/12/2020 1:34 PM

## 2020-01-12 NOTE — ED Triage Notes (Signed)
Pt c/o of abscess in her genital area. She noticed it last week. She states she gave birth about 3 weeks ago. She states it has drained some.

## 2020-01-12 NOTE — H&P (Signed)
History and Physical    Alfred Eckley HFW:[id number] DOB: 06/28/1996 DOA: 01/12/2020  Referring MD/NP/PA:   PCP: [facility] [place]   Patient coming from:  The patient is coming from home.  At baseline, pt is independent for most of ADL.        Chief Complaint: pain in left buttock  HPI: Leslie Trevino is a 24 y.o. female with medical history significant of molar pregnancy, miscarriage, preeclampsia, who presents with pain in left buttock.  Patient states she has pain in the left buttock area for almost 2 weeks, which has worsened in the past several days.  The pain is constant, sharp, severe, nonradiating.  She is not sure if she has any drainage in this area.  Denies fever or chills.  No chest pain, cough or shortness of breath.  No nausea vomiting, diarrhea, abdominal pain, symptoms of UTI or unilateral weakness.  Of note, patient gave birth to a healthy boy 3 weeks ago  ED Course: pt was found to have WBC 12.6, pending COVID-19 PCR, renal function normal, temperature 97.9, blood pressure 119/86, tachycardia, RR 17, oxygen saturation 100% on room air.  Patient is placed on MedSurg bed for position  CT abdomen/pelvis showed 1. Inflammatory changes are seen involving the subcutaneous tissues of the left buttocks region medially most consistent with cellulitis. However, no definite fluid collection or abscess is noted. 2. Enlarged uterus is noted consistent with recently postpartum status.  Review of Systems:   General: no fevers, chills, no body weight gain, has fatigue HEENT: no blurry vision, hearing changes or sore throat Respiratory: no dyspnea, coughing, wheezing CV: no chest pain, no palpitations GI: no nausea, vomiting, abdominal pain, diarrhea, constipation GU: no dysuria, burning on urination, increased urinary frequency, hematuria  Ext: no leg edema Neuro: no unilateral weakness, numbness, or tingling, no vision change or hearing loss Skin: has pain in left  buttock MSK: No muscle spasm, no deformity, no limitation of range of movement in spin Heme: No easy bruising.  Travel history: No recent long distant travel.  Allergy: No Known Allergies  Past Medical History:  Diagnosis Date  . History of miscarriage   . Molar pregnancy     Past Surgical History:  Procedure Laterality Date  . NO PAST SURGERIES      Social History:  reports that she has quit smoking. Her smoking use included cigarettes. She smoked 0.25 packs per day. She has never used smokeless tobacco. She reports previous alcohol use. She reports that she does not use drugs.  Family History:  Family History  Problem Relation Age of Onset  . Healthy Mother   . Healthy Father      Prior to Admission medications   Medication Sig Start Date End Date Taking? Authorizing Provider  acetaminophen (TYLENOL) 325 MG tablet Take 2 tablets (650 mg total) by mouth every 4 (four) hours as needed (for pain scale < 4). 12/24/19   Minda Meo, CNM  b complex vitamins tablet Take 1 tablet by mouth daily.    [provider]  Blood Pressure KIT 1 kit by Does not apply route daily. Check blood pressure once daily.  Call provider for blood pressures 160/110 or greater. 12/24/19   Minda Meo, CNM  ferrous sulfate 325 (65 FE) MG tablet Take 325 mg by mouth daily with breakfast.  01/12/20  [provider]  NIFEdipine (ADALAT CC) 60 MG 24 hr tablet Take 1 tablet (60 mg total) by mouth daily. 12/25/19  01/12/20  Minda Meo, CNM  Potassium 99 MG TABS Take by mouth.  09/21/19  [provider]    Physical Exam: Vitals:   01/12/20 0923 01/12/20 1530 01/12/20 1806  BP: 119/86 120/70 114/79  Pulse: (!) 117 (!) 102 (!) 110  Resp: '17 16 18  ' Temp: 97.9 F (36.6 C) 97.9 F (36.6 C) (!) 100.4 F (38 C)  TempSrc: Oral  Oral  SpO2: 100% 100% 100%  Weight: 78.2 kg    Height: '5\' 5"'  (1.651 m)     General: Not in acute distress HEENT:       Eyes: PERRL, EOMI, no scleral  icterus.       ENT: No discharge from the ears and nose, no pharynx injection, no tonsillar enlargement.        Neck: No JVD, no bruit, no mass felt. Heme: No neck lymph node enlargement. Cardiac: S1/S2, RRR, No murmurs, No gallops or rubs. Respiratory: Good air movement bilaterally. No rales, wheezing, rhonchi or rubs. GI: Soft, nondistended, nontender, no rebound pain, no organomegaly, BS present. GU: No hematuria Ext: No pitting leg edema bilaterally. 2+DP/PT pulse bilaterally. Musculoskeletal: No joint deformities, No joint redness or warmth, no limitation of ROM in spin. Skin: No rashes.  Neuro: Alert, oriented X3, cranial nerves II-XII grossly intact, moves all extremities normally. Muscle strength 5/5 in all extremities, sensation to light touch intact. Brachial reflex 2+ bilaterally. Knee reflex 1+ bilaterally. Negative Babinski's sign. Normal finger to nose test. Psych: Patient is not psychotic, no suicidal or hemocidal ideation.  Labs on Admission: I have personally reviewed following labs and imaging studies  CBC: Recent Labs  Lab 01/12/20 1052  WBC 12.6*  NEUTROABS 9.7*  HGB 12.0  HCT 36.2  MCV 83.4  PLT 144   Basic Metabolic Panel: Recent Labs  Lab 01/12/20 1052  NA 135  K 3.6  CL 108  CO2 18*  GLUCOSE 77  BUN 11  CREATININE 0.61  CALCIUM 9.0   GFR: Estimated Creatinine Clearance: 112.1 mL/min (by C-G formula based on SCr of 0.61 mg/dL). Liver Function Tests: No results for input(s): AST, ALT, ALKPHOS, BILITOT, PROT, ALBUMIN in the last 168 hours. No results for input(s): LIPASE, AMYLASE in the last 168 hours. No results for input(s): AMMONIA in the last 168 hours. Coagulation Profile: Recent Labs  Lab 01/12/20 1356  INR 1.1   Cardiac Enzymes: No results for input(s): CKTOTAL, CKMB, CKMBINDEX, TROPONINI in the last 168 hours. BNP (last 3 results) No results for input(s): PROBNP in the last 8760 hours. HbA1C: No results for input(s): HGBA1C in the  last 72 hours. CBG: No results for input(s): GLUCAP in the last 168 hours. Lipid Profile: No results for input(s): CHOL, HDL, LDLCALC, TRIG, CHOLHDL, LDLDIRECT in the last 72 hours. Thyroid Function Tests: No results for input(s): TSH, T4TOTAL, FREET4, T3FREE, THYROIDAB in the last 72 hours. Anemia Panel: No results for input(s): VITAMINB12, FOLATE, FERRITIN, TIBC, IRON, RETICCTPCT in the last 72 hours. Urine analysis:    Component Value Date/Time   COLORURINE YELLOW (A) 01/12/2020 1052   APPEARANCEUR CLEAR (A) 01/12/2020 1052   LABSPEC 1.018 01/12/2020 1052   PHURINE 6.0 01/12/2020 1052   GLUCOSEU NEGATIVE 01/12/2020 1052   HGBUR SMALL (A) 01/12/2020 1052   BILIRUBINUR NEGATIVE 01/12/2020 1052   KETONESUR 5 (A) 01/12/2020 1052   PROTEINUR NEGATIVE 01/12/2020 1052   NITRITE NEGATIVE 01/12/2020 1052   LEUKOCYTESUR TRACE (A) 01/12/2020 1052   Sepsis Labs: '@LABRCNTIP' (procalcitonin:4,lacticidven:4) ) Recent Results (from the  past 240 hour(s))  SARS Coronavirus 2 by RT PCR (hospital order, performed in [facility] hospital lab) Nasopharyngeal Nasopharyngeal Swab     Status: None   Collection Time: 01/12/20  1:26 PM   Specimen: Nasopharyngeal Swab  Result Value Ref Range Status   SARS Coronavirus 2 NEGATIVE NEGATIVE Final    Comment: (NOTE) SARS-CoV-2 target nucleic acids are NOT DETECTED.  The SARS-CoV-2 RNA is generally detectable in upper and lower respiratory specimens during the acute phase of infection. The lowest concentration of SARS-CoV-2 viral copies this assay can detect is 250 copies / mL. A negative result does not preclude SARS-CoV-2 infection and should not be used as the sole basis for treatment or other patient management decisions.  A negative result may occur with improper specimen collection / handling, submission of specimen other than nasopharyngeal swab, presence of viral mutation(s) within the areas targeted by this assay, and inadequate number of viral  copies (<250 copies / mL). A negative result must be combined with clinical observations, patient history, and epidemiological information.  Fact Sheet for Patients:   StrictlyIdeas.no  Fact Sheet for Healthcare Providers: BankingDealers.co.za  This test is not yet approved or  cleared by the [place] FDA and has been authorized for detection and/or diagnosis of SARS-CoV-2 by FDA under an Emergency Use Authorization (EUA).  This EUA will remain in effect (meaning this test can be used) for the duration of the COVID-19 declaration under Section 564(b)(1) of the Act, 21 U.S.C. section 360bbb-3(b)(1), unless the authorization is terminated or revoked sooner.  Performed at [facility], [address] [place], [place] [address]      Radiological Exams on Admission: CT PELVIS W CONTRAST  Result Date: 01/12/2020 CLINICAL DATA:  Abscess of anal and rectal region. EXAM: CT PELVIS WITH CONTRAST TECHNIQUE: Multidetector CT imaging of the pelvis was performed using the standard protocol following the bolus administration of intravenous contrast. CONTRAST:  126m OMNIPAQUE IOHEXOL 300 MG/ML  SOLN COMPARISON:  None. FINDINGS: Urinary Tract:  No abnormality visualized. Bowel:  Unremarkable visualized pelvic bowel loops. Vascular/Lymphatic: No pathologically enlarged lymph nodes. No significant vascular abnormality seen. Reproductive: Enlarged uterus is noted consistent with recently postpartum status. No definite adnexal abnormality is noted. Other: There are inflammatory changes seen involving the subcutaneous tissues of the left buttocks region medially most consistent with cellulitis. However, no definite fluid collection or abscess is noted. Musculoskeletal: No suspicious bone lesions identified. IMPRESSION: 1. Inflammatory changes are seen involving the subcutaneous tissues of the left buttocks region medially most consistent with  cellulitis. However, no definite fluid collection or abscess is noted. 2. Enlarged uterus is noted consistent with recently postpartum status. Electronically Signed   By: JMarijo ConceptionM.D.   On: 01/12/2020 12:11     EKG:  Not done in ED, will get one.   Assessment/Plan Principal Problem:   Cellulitis of left buttock Active Problems:   Sepsis (HCC)   Elevated troponin   Sepsis due to Cellulitis of left buttock: CT showed colitis, but no drainable abscess formation.  Patient meets criteria for sepsis with leukocytosis and tachycardia.  Lactic acid is normal.  Currently hemodynamically stable  - Placed on MedSurg bed for observation - Empiric antimicrobial treatment with vancomycin and Flagyl - PRN Zofran for nausea, morphine and Percocet for pain - Blood cultures x 2  - ESR and CRP - will get Procalcitonin and trend lactic acid levels per sepsis protocol. - IVF: 1.0 L of NS bolus in ED,  followed by 75 cc/h   DVT ppx: SQ Heparin  Code Status: Full code Family Communication: not done, no family member is at bed side.   Disposition Plan:  Anticipate discharge back to previous environment Consults called:  none Admission status: Med-surg bed for obs     Status is: Observation  The patient remains OBS appropriate and will d/c before 2 midnights.  Dispo: The patient is from: Home              Anticipated d/c is to: Home              Anticipated d/c date is: 1 day              Patient currently is not medically stable to d/c.           Date of Service 01/12/2020    Ivor Costa [facility]   If 7PM-7AM, please contact night-coverage [email or web address] 01/12/2020, 6:23 PM

## 2020-01-13 DIAGNOSIS — D72829 Elevated white blood cell count, unspecified: Secondary | ICD-10-CM | POA: Diagnosis not present

## 2020-01-13 DIAGNOSIS — L03317 Cellulitis of buttock: Secondary | ICD-10-CM | POA: Diagnosis present

## 2020-01-13 DIAGNOSIS — L0231 Cutaneous abscess of buttock: Secondary | ICD-10-CM | POA: Diagnosis present

## 2020-01-13 DIAGNOSIS — L03315 Cellulitis of perineum: Secondary | ICD-10-CM | POA: Diagnosis not present

## 2020-01-13 DIAGNOSIS — Z79899 Other long term (current) drug therapy: Secondary | ICD-10-CM | POA: Diagnosis not present

## 2020-01-13 DIAGNOSIS — L0291 Cutaneous abscess, unspecified: Secondary | ICD-10-CM | POA: Diagnosis not present

## 2020-01-13 DIAGNOSIS — E876 Hypokalemia: Secondary | ICD-10-CM | POA: Diagnosis present

## 2020-01-13 DIAGNOSIS — Z87891 Personal history of nicotine dependence: Secondary | ICD-10-CM | POA: Diagnosis not present

## 2020-01-13 DIAGNOSIS — R778 Other specified abnormalities of plasma proteins: Secondary | ICD-10-CM | POA: Diagnosis present

## 2020-01-13 DIAGNOSIS — Z20822 Contact with and (suspected) exposure to covid-19: Secondary | ICD-10-CM | POA: Diagnosis present

## 2020-01-13 LAB — BASIC METABOLIC PANEL
Anion gap: 6 (ref 5–15)
BUN: 11 mg/dL (ref 6–20)
CO2: 21 mmol/L — ABNORMAL LOW (ref 22–32)
Calcium: 8.3 mg/dL — ABNORMAL LOW (ref 8.9–10.3)
Chloride: 109 mmol/L (ref 98–111)
Creatinine, Ser: 0.58 mg/dL (ref 0.44–1.00)
GFR calc Af Amer: >60 mL/min (ref >60)
GFR calc non Af Amer: >60 mL/min (ref >60)
Glucose, Bld: 88 mg/dL (ref 70–99)
Potassium: 3.5 mmol/L (ref 3.5–5.1)
Sodium: 136 mmol/L (ref 135–145)

## 2020-01-13 LAB — CBC
HCT: 31.9 % — ABNORMAL LOW (ref 36.0–46.0)
Hemoglobin: 10.9 g/dL — ABNORMAL LOW (ref 12.0–15.0)
MCH: 27.9 pg (ref 26.0–34.0)
MCHC: 34.2 g/dL (ref 30.0–36.0)
MCV: 81.6 fL (ref 80.0–100.0)
Platelets: 296 10*3/uL (ref 150–400)
RBC: 3.91 MIL/uL (ref 3.87–5.11)
RDW: 16.7 % — ABNORMAL HIGH (ref 11.5–15.5)
WBC: 11 10*3/uL — ABNORMAL HIGH (ref 4.0–10.5)
nRBC: 0 % (ref 0.0–0.2)

## 2020-01-13 MED ORDER — SODIUM CHLORIDE 0.9 % IV SOLN
3.0000 g | Freq: Four times a day (QID) | INTRAVENOUS | Status: DC
  Administered 2020-01-13 – 2020-01-14 (×4): 3 g via INTRAVENOUS
  Filled 2020-01-13: qty 8
  Filled 2020-01-13 (×2): qty 3
  Filled 2020-01-13: qty 8
  Filled 2020-01-13: qty 3
  Filled 2020-01-13: qty 8
  Filled 2020-01-13: qty 3

## 2020-01-13 MED ORDER — POTASSIUM CHLORIDE CRYS ER 20 MEQ PO TBCR
40.0000 meq | EXTENDED_RELEASE_TABLET | Freq: Once | ORAL | Status: AC
  Administered 2020-01-13: 18:00:00 40 meq via ORAL
  Filled 2020-01-13: qty 2

## 2020-01-13 NOTE — Consult Note (Signed)
Pharmacy Antibiotic Note  Leslie Trevino is a 24 y.o. female admitted on 01/12/2020 with cellulitis.  Pharmacy has been consulted for vancomycin dosing. Patient gave birth 3 weeks ago, no longer pregnant, will continue with vancoReady. Patient received vancomycin 2000mg  loading dose.   Pharmacy also consulted for Unasyn dosing.  Plan: Continue 1500 mg q12H per  nomogram.  Plan to draw levels in the next 2-3 days.   Will start Unasyn 3g IV every 6 hours   Height: 5\' 5"  (165.1 cm) Weight: 78.2 kg (172 lb 6.4 oz) IBW/kg (Calculated) : 57  Temp (24hrs), Avg:98.9 F (37.2 C), Min:97.9 F (36.6 C), Max:100.4 F (38 C)  Recent Labs  Lab 01/12/20 1052 01/12/20 1356 01/13/20 0441  WBC 12.6*  --  11.0*  CREATININE 0.61  --  0.58  LATICACIDVEN  --  0.6  --     Estimated Creatinine Clearance: 112.1 mL/min (by C-G formula based on SCr of 0.58 mg/dL).    No Known Allergies   Antibiotics This Admission: 6/22 vancomycin>> 6/22 Flagyl>> 6/23 6/23 unasyn>>   Thank you for allowing pharmacy to be a part of this patient's care.  7/23, PharmD, BCPS 01/13/2020 12:09 PM

## 2020-01-13 NOTE — Progress Notes (Signed)
Patient ID: Leslie Trevino, female   DOB: 05-26-1996, 24 y.o.   MRN: [id number] [facility] PROGRESS NOTE  Leslie Trevino NOB:[id number] DOB: 01-02-1996 DOA: 01/12/2020 PCP: [facility]  HPI/Subjective: Patient coming into the hospital with pain in her perineum.  Patient recently had a vaginal delivery.  Admitted for cellulitis and abscess.  Patient still having pain and hardly allows me to touch the area.  Patient seen and examined with the patient's nurse today Selena Batten.  Objective: Vitals:   01/13/20 0831 01/13/20 1122  BP: 101/70 106/76  Pulse: 81 75  Resp: 16 18  Temp: 98.8 F (37.1 C) 98.9 F (37.2 C)  SpO2: 95% 99%    Intake/Output Summary (Last 24 hours) at 01/13/2020 1530 Last data filed at 01/13/2020 1415 Gross per 24 hour  Intake 2229.59 ml  Output --  Net 2229.59 ml   Filed Weights   01/12/20 0923  Weight: 78.2 kg    ROS: Review of Systems  Respiratory: Negative for shortness of breath.   Cardiovascular: Negative for chest pain.  Gastrointestinal: Negative for abdominal pain.  Musculoskeletal: Positive for joint pain.   Exam: Physical Exam  Constitutional: She is oriented to person, place, and time.  HENT:  Nose: No mucosal edema.  Mouth/Throat: No oropharyngeal exudate.  Eyes: Pupils are equal, round, and reactive to light. Lids are normal.  Cardiovascular: Normal rate, S1 normal, S2 normal and normal heart sounds.  Respiratory: She has no decreased breath sounds. She has no wheezes. She has no rhonchi. She has no rales.  GI: Soft. There is no abdominal tenderness.  Musculoskeletal:     Right ankle: No swelling.     Left ankle: No swelling.  Neurological: She is alert and oriented to person, place, and time.  Skin:  In her perineum there is large area of swelling which looks like it is coming to ahead.  Very tender even with light palpation of the area.  Psychiatric: Mood normal.      Data Reviewed: Basic Metabolic  Panel: Recent Labs  Lab 01/12/20 1052 01/13/20 0441  NA 135 136  K 3.6 3.5  CL 108 109  CO2 18* 21*  GLUCOSE 77 88  BUN 11 11  CREATININE 0.61 0.58  CALCIUM 9.0 8.3*   CBC: Recent Labs  Lab 01/12/20 1052 01/13/20 0441  WBC 12.6* 11.0*  NEUTROABS 9.7*  --   HGB 12.0 10.9*  HCT 36.2 31.9*  MCV 83.4 81.6  PLT 308 296     Recent Results (from the past 240 hour(s))  SARS Coronavirus 2 by RT PCR (hospital order, performed in [facility] hospital lab) Nasopharyngeal Nasopharyngeal Swab     Status: None   Collection Time: 01/12/20  1:26 PM   Specimen: Nasopharyngeal Swab  Result Value Ref Range Status   SARS Coronavirus 2 NEGATIVE NEGATIVE Final    Comment: (NOTE) SARS-CoV-2 target nucleic acids are NOT DETECTED.  The SARS-CoV-2 RNA is generally detectable in upper and lower respiratory specimens during the acute phase of infection. The lowest concentration of SARS-CoV-2 viral copies this assay can detect is 250 copies / mL. A negative result does not preclude SARS-CoV-2 infection and should not be used as the sole basis for treatment or other patient management decisions.  A negative result may occur with improper specimen collection / handling, submission of specimen other than nasopharyngeal swab, presence of viral mutation(s) within the areas targeted by this assay, and inadequate number of viral copies (<250 copies / mL). A  negative result must be combined with clinical observations, patient history, and epidemiological information.  Fact Sheet for Patients:   StrictlyIdeas.no  Fact Sheet for Healthcare Providers: BankingDealers.co.za  This test is not yet approved or  cleared by the [place] FDA and has been authorized for detection and/or diagnosis of SARS-CoV-2 by FDA under an Emergency Use Authorization (EUA).  This EUA will remain in effect (meaning this test can be used) for the duration of the COVID-19  declaration under Section 564(b)(1) of the Act, 21 U.S.C. section 360bbb-3(b)(1), unless the authorization is terminated or revoked sooner.  Performed at [facility], [address] [place], [place] [address]   Culture, blood (x 2)     Status: None (Preliminary result)   Collection Time: 01/12/20  1:56 PM   Specimen: BLOOD  Result Value Ref Range Status   Specimen Description BLOOD BLOOD RIGHT HAND  Final   Special Requests   Final    BOTTLES DRAWN AEROBIC AND ANAEROBIC Blood Culture adequate volume   Culture   Final    NO GROWTH < 24 HOURS Performed at [facility], [address], [place], [place] [address]    Report Status PENDING  Incomplete  Culture, blood (x 2)     Status: None (Preliminary result)   Collection Time: 01/12/20  1:57 PM   Specimen: BLOOD  Result Value Ref Range Status   Specimen Description BLOOD BLOOD LEFT HAND  Final   Special Requests   Final    BOTTLES DRAWN AEROBIC AND ANAEROBIC Blood Culture adequate volume   Culture   Final    NO GROWTH < 24 HOURS Performed at [facility], [address], [place], [place] [address]    Report Status PENDING  Incomplete     Studies: CT PELVIS W CONTRAST  Result Date: 01/12/2020 CLINICAL DATA:  Abscess of anal and rectal region. EXAM: CT PELVIS WITH CONTRAST TECHNIQUE: Multidetector CT imaging of the pelvis was performed using the standard protocol following the bolus administration of intravenous contrast. CONTRAST:  156mL OMNIPAQUE IOHEXOL 300 MG/ML  SOLN COMPARISON:  None. FINDINGS: Urinary Tract:  No abnormality visualized. Bowel:  Unremarkable visualized pelvic bowel loops. Vascular/Lymphatic: No pathologically enlarged lymph nodes. No significant vascular abnormality seen. Reproductive: Enlarged uterus is noted consistent with recently postpartum status. No definite adnexal abnormality is noted. Other: There are inflammatory changes seen involving the subcutaneous tissues of the left  buttocks region medially most consistent with cellulitis. However, no definite fluid collection or abscess is noted. Musculoskeletal: No suspicious bone lesions identified. IMPRESSION: 1. Inflammatory changes are seen involving the subcutaneous tissues of the left buttocks region medially most consistent with cellulitis. However, no definite fluid collection or abscess is noted. 2. Enlarged uterus is noted consistent with recently postpartum status. Electronically Signed   By: Marijo Conception M.D.   On: 01/12/2020 12:11    Scheduled Meds: . enoxaparin (LOVENOX) injection  40 mg Subcutaneous Q24H   Continuous Infusions: . sodium chloride 75 mL/hr at 01/13/20 1344  . ampicillin-sulbactam (UNASYN) IV 3 g (01/13/20 1344)  . vancomycin 1,500 mg (01/13/20 1510)    Assessment/Plan:  1. Perineum cellulitis and some fullness concerning for abscess.  Leukocytosis.  Case discussed with gynecology and they just want to do antibiotics for right now.  Continue Unasyn and vancomycin. 2. Hypokalemia.  Replace potassium.     Code Status:     Code Status Orders  (From admission, onward)  Start     Ordered   01/12/20 1335  Full code  Continuous        01/12/20 1335        Code Status History    Date Active Date Inactive Code Status Order ID Comments User Context   12/21/2019 1406 12/25/2019 0051 Full Code [id number]  Haroldine Laws, CNM Inpatient   12/21/2019 1145 12/21/2019 1406 Full Code [id number]  Haroldine Laws, CNM Inpatient   12/21/2019 0750 12/21/2019 1144 Full Code [id number]  Haroldine Laws, CNM Inpatient   Advance Care Planning Activity     Family Communication: Patient deferred me calling family at this time. Disposition Plan: Status is: Inpatient  Dispo: The patient is from: Home              Anticipated d/c is to: Home              Anticipated d/c date is: Patient will likely need a few days of IV antibiotics.              Patient currently being treated for cellulitis and  potential starting of an abscess in the perineum.  Requires IV antibiotics and close clinical follow-up here in the hospital.  Consultants:  Gynecology  Antibiotics:  Vancomycin  Unasyn  Time spent: 28 minutes  Kerith Sherley [employer]

## 2020-01-13 NOTE — Consult Note (Signed)
Consult History and Physical   SERVICE: Gynecology   Patient Name: Leslie Trevino Patient MRN:   [id number]  CC: Left buttocks cellulitis   HPI: Leslie Trevino is a 24 y.o. [id number] with 10x8cm area of cellulitis on the medial left buttock, with the border close to the midline but sparing the perineum currently. She is recently postpartum, s/p NSVD at term on 12/21/19 with a 1st deg laceration repaired with 3-0 vicryl. Over the last two weeks, she has had increasing pain in her left buttocks. No increase in vaginal bleeding, fevers, odorous discharge. Is not breastfeeding.   Prior hx of "boil" under her left axilla that resolved spontaneously. No hx of under breast or inguinal drainage, tracts or HS.  Pt currently on weight-dosed vancomycin (1500mg  q12h) and q6hr unasyn iv 3g.   Past Obstetrical History: OB History    Gravida  2   Para  2   Term  2   Preterm      AB      Living  2     SAB      TAB      Ectopic      Multiple  0   Live Births  1           Past Gynecologic History: No LMP recorded.   Past Medical History: Past Medical History:  Diagnosis Date  . History of miscarriage   . Molar pregnancy     Past Surgical History:   Past Surgical History:  Procedure Laterality Date  . NO PAST SURGERIES      Family History:  family history includes Healthy in her father and mother.  Social History:  Social History   Socioeconomic History  . Marital status: Single    Spouse name: Not on file  . Number of children: Not on file  . Years of education: Not on file  . Highest education level: Not on file  Occupational History  . Not on file  Tobacco Use  . Smoking status: Former Smoker    Packs/day: 0.25    Types: Cigarettes  . Smokeless tobacco: Never Used  Vaping Use  . Vaping Use: Never used  Substance and Sexual Activity  . Alcohol use: Not Currently  . Drug use: Never  . Sexual activity: Not on file  Other Topics Concern  . Not on  file  Social History Narrative  . Not on file   Social Determinants of Health   Financial Resource Strain:   . Difficulty of Paying Living Expenses:   Food Insecurity:   . Worried About in the Last Year:   . Programme researcher, broadcasting/film/video in the Last Year:   Transportation Needs:   . Barista (Medical):   Freight forwarder Lack of Transportation (Non-Medical):   Physical Activity:   . Days of Exercise per Week:   . Minutes of Exercise per Session:   Stress:   . Feeling of Stress :   Social Connections:   . Frequency of Communication with Friends and Family:   . Frequency of Social Gatherings with Friends and Family:   . Attends Religious Services:   . Active Member of Clubs or Organizations:   . Attends Marland [employer] Meetings:   Banker Marital Status:   Intimate Partner Violence:   . Fear of Current or Ex-Partner:   . Emotionally Abused:   [employer] Physically Abused:   . Sexually Abused:     Home Medications:  Medications reconciled in  EPIC  No current facility-administered medications on file prior to encounter.   Current Outpatient Medications on File Prior to Encounter  Medication Sig Dispense Refill  . clotrimazole-betamethasone (LOTRISONE) cream Apply 1 application topically 2 (two) times daily. APPLY TO AFFECTED AREA (Patient not taking: Reported on 01/12/2020)    . [DISCONTINUED] ferrous sulfate 325 (65 FE) MG tablet Take 325 mg by mouth daily with breakfast.    . [DISCONTINUED] NIFEdipine (ADALAT CC) 60 MG 24 hr tablet Take 1 tablet (60 mg total) by mouth daily. 30 tablet 3  . [DISCONTINUED] Potassium 99 MG TABS Take by mouth.      Allergies:  No Known Allergies  Physical Exam:  Temp:  [97.9 F (36.6 C)-100.4 F (38 C)] 98.9 F (37.2 C) (06/23 1122) Pulse Rate:  [67-110] 75 (06/23 1122) Resp:  [16-20] 18 (06/23 1122) BP: (83-120)/(50-79) 106/76 (06/23 1122) SpO2:  [95 %-100 %] 99 % (06/23 1122)   General Appearance:  Well developed, well nourished, no  acute distress, alert and oriented x3 HEENT:  Normocephalic atraumatic, extraocular movements intact, moist mucous membranes Abdomen:  Bowel sounds present, soft, nontender, nondistended, no abnormal masses, no epigastric pain Extremities:  Full range of motion, no pedal edema, 2+ distal pulses, no tenderness Skin:  normal coloration and turgor, no rashes, no suspicious skin lesions noted  Neurologic:  Cranial nerves 2-12 grossly intact Psychiatric:  Normal mood and affect, appropriate, no AH/VH Pelvic:  Well approximated perineal laceration, no pus or erythema. Dark blood in vaginal vault. Normal clitoris and external urethral meatus. Exquisitely tender to palpation just lateral to midline at the left buttock, 10x8cm area of cellulitis with two areas of fluctuance but no clear consolidation that could be drained. No communicating lesion between vaginal canal and this cellulitis, and the perineum is clear.    Labs/Studies:   CBC and Coags:  Lab Results  Component Value Date   WBC 11.0 (H) 01/13/2020   NEUTOPHILPCT 78 01/12/2020   EOSPCT 0 01/12/2020   BASOPCT 0 01/12/2020   LYMPHOPCT 16 01/12/2020   HGB 10.9 (L) 01/13/2020   HCT 31.9 (L) 01/13/2020   MCV 81.6 01/13/2020   PLT 296 01/13/2020   INR 1.1 01/12/2020   CMP:  Lab Results  Component Value Date   NA 136 01/13/2020   K 3.5 01/13/2020   CL 109 01/13/2020   CO2 21 (L) 01/13/2020   BUN 11 01/13/2020   CREATININE 0.58 01/13/2020   CREATININE 0.61 01/12/2020   CREATININE 0.59 12/22/2019   PROT 5.4 (L) 12/22/2019   BILITOT 0.6 12/22/2019   ALT 22 12/22/2019   AST 33 12/22/2019   ALKPHOS 159 (H) 12/22/2019    Other Imaging: CT PELVIS W CONTRAST  Result Date: 01/12/2020 CLINICAL DATA:  Abscess of anal and rectal region. EXAM: CT PELVIS WITH CONTRAST TECHNIQUE: Multidetector CT imaging of the pelvis was performed using the standard protocol following the bolus administration of intravenous contrast. CONTRAST:  169mL  OMNIPAQUE IOHEXOL 300 MG/ML  SOLN COMPARISON:  None. FINDINGS: Urinary Tract:  No abnormality visualized. Bowel:  Unremarkable visualized pelvic bowel loops. Vascular/Lymphatic: No pathologically enlarged lymph nodes. No significant vascular abnormality seen. Reproductive: Enlarged uterus is noted consistent with recently postpartum status. No definite adnexal abnormality is noted. Other: There are inflammatory changes seen involving the subcutaneous tissues of the left buttocks region medially most consistent with cellulitis. However, no definite fluid collection or abscess is noted. Musculoskeletal: No suspicious bone lesions identified. IMPRESSION: 1. Inflammatory changes are seen involving the  subcutaneous tissues of the left buttocks region medially most consistent with cellulitis. However, no definite fluid collection or abscess is noted. 2. Enlarged uterus is noted consistent with recently postpartum status. Electronically Signed   By: Lupita Raider M.D.   On: 01/12/2020 12:11     Assessment / Plan:   Leslie Trevino is a 24 y.o. [id number] who presents with left buttock cellulitis and evolving abscess. May have hx of hidradenitis but no scars present on exam. Current management with iv abx and warm compresses.  -Once an area of consolidation is evident, I&D is likely to be helpful. The area of cellulitis is large enough that abx would likely need to be continued. If it doesn't come to a more pronounced head, I&D is still likely to improve her pain. I recommend continuing the iv abx for total of 48 hrs and trial I&D if her pain isn't improved. -Because this doesn't appear to be a sequela of her vaginal delivery and is in the lateral perineum, I have asked gen surg to weigh in. - 3x daily sitz baths with warm compresses as tolerated -area not marked today because of location -we will continue to follow - not breastfeeding, no limitations of abx - appreciate primary team

## 2020-01-14 MED ORDER — AMOXICILLIN-POT CLAVULANATE 875-125 MG PO TABS: 1.0000 | ORAL_TABLET | Freq: Two times a day (BID) | ORAL | 0 refills | Status: DC

## 2020-01-14 MED ORDER — DOXYCYCLINE HYCLATE 100 MG PO TABS: 100.0000 mg | ORAL_TABLET | Freq: Two times a day (BID) | ORAL | 0 refills | Status: DC

## 2020-01-14 MED ORDER — AMOXICILLIN-POT CLAVULANATE 875-125 MG PO TABS: 1.0000 | ORAL_TABLET | Freq: Two times a day (BID) | ORAL | Status: DC

## 2020-01-14 MED ORDER — DOXYCYCLINE HYCLATE 100 MG PO TABS
100.0000 mg | ORAL_TABLET | Freq: Two times a day (BID) | ORAL | Status: DC
  Administered 2020-01-14: 08:00:00 100 mg via ORAL
  Filled 2020-01-14: qty 1

## 2020-01-14 NOTE — Discharge Summary (Signed)
[facility] [place] [facility]   PATIENT NAME: Leslie Trevino    MR#:  [id number]  DATE OF BIRTH:  Jan 06, 1996  DATE OF ADMISSION:  01/12/2020 ADMITTING PHYSICIAN: Alford Highland, MD  DATE OF DISCHARGE: 01/14/2020  1:23 PM  PRIMARY CARE PHYSICIAN: [facility]    ADMISSION DIAGNOSIS:  Cellulitis and abscess of buttock [L02.31, L03.317] Cellulitis of left buttock [L03.317] Abscess [L02.91]  DISCHARGE DIAGNOSIS:  Cellulitis of the perineum with possible abscess  SECONDARY DIAGNOSIS:   Past Medical History:  Diagnosis Date  . History of miscarriage   . Molar pregnancy     HOSPITAL COURSE:  1.  Perineum cellulitis with fullness concerning for abscess.  Patient stated that she needs to get out of the hospital today because her baby is not eating with her family and wants to get home to take care of him.  The patient was on IV Unasyn and vancomycin while here in the hospital.  Today on examination the area does have some fullness but is softer and is coming to ahead.  The patient is not having any pain there and she states it is feeling better and wanted to go home.  Case discussed with Dr. Dalbert Garnet gynecology that she needs to go home.  I will have her follow-up with Dr. Dalbert Garnet gynecology and general surgery.  Continue warm soaks or sitz bath's.  I discharged home on Augmentin and doxycycline. 2.  Hypokalemia given potassium during the hospital course   DISCHARGE CONDITIONS:   Satisfactory  CONSULTS OBTAINED:  Treatment Team:  Christeen Douglas, MD  DRUG ALLERGIES:  No Known Allergies  DISCHARGE MEDICATIONS:   Allergies as of 01/14/2020   No Known Allergies     Medication List    STOP taking these medications   clotrimazole-betamethasone cream Commonly known as: LOTRISONE     TAKE these medications   amoxicillin-clavulanate 875-125 MG tablet Commonly known as: AUGMENTIN Take 1 tablet by mouth every 12 (twelve) hours.    doxycycline 100 MG tablet Commonly known as: VIBRA-TABS Take 1 tablet (100 mg total) by mouth every 12 (twelve) hours.        DISCHARGE INSTRUCTIONS:   Follow-up gynecology 5 days Follow-up general surgery 1 week Follow-up PMD 2 weeks  If you experience worsening of your admission symptoms, develop shortness of breath, life threatening emergency, suicidal or homicidal thoughts you must seek medical attention immediately by calling 911 or calling your MD immediately  if symptoms less severe.  You Must read complete instructions/literature along with all the possible adverse reactions/side effects for all the Medicines you take and that have been prescribed to you. Take any new Medicines after you have completely understood and accept all the possible adverse reactions/side effects.   Please note  You were cared for by a hospitalist during your hospital stay. If you have any questions about your discharge medications or the care you received while you were in the hospital after you are discharged, you can call the unit and asked to speak with the hospitalist on call if the hospitalist that took care of you is not available. Once you are discharged, your primary care physician will handle any further medical issues. Please note that NO REFILLS for any discharge medications will be authorized once you are discharged, as it is imperative that you return to your primary care physician (or establish a relationship with a primary care physician if you do not have one) for your aftercare needs so that  they can reassess your need for medications and monitor your lab values.    Today   CHIEF COMPLAINT:   Chief Complaint  Patient presents with  . Abscess    HISTORY OF PRESENT ILLNESS:  Leslie Trevino  is a 24 y.o. female coming in with pain and swelling in her perineum   VITAL SIGNS:  Blood pressure 97/63, pulse 63, temperature 98.4 F (36.9 C), temperature source Oral, resp. rate 16,  height 5\' 5"  (1.651 m), weight 78.2 kg, SpO2 100 %, not currently breastfeeding.  I/O:    Intake/Output Summary (Last 24 hours) at 01/14/2020 1433 Last data filed at 01/14/2020 0301 Gross per 24 hour  Intake 1152.88 ml  Output --  Net 1152.88 ml    PHYSICAL EXAMINATION:  GENERAL:  24 y.o.-year-old patient lying in the bed with no acute distress.   LUNGS: Normal breath sounds bilaterally, no wheezing, rales,rhonchi or crepitation. No use of accessory muscles of respiration.  CARDIOVASCULAR: S1, S2 normal. No murmurs, rubs, or gallops.  ABDOMEN: Soft, non-tender, non-distended.  EXTREMITIES: No pedal edema.  SKIN: Area of fullness yesterday is less full today and able to palpate without her guarding.  The area looks like it is coming to ahead but has not drained yet.  DATA REVIEW:   CBC Recent Labs  Lab 01/13/20 0441  WBC 11.0*  HGB 10.9*  HCT 31.9*  PLT 296    Chemistries  Recent Labs  Lab 01/13/20 0441  NA 136  K 3.5  CL 109  CO2 21*  GLUCOSE 88  BUN 11  CREATININE 0.58  CALCIUM 8.3*     Microbiology Results  Results for orders placed or performed during the hospital encounter of 01/12/20  SARS Coronavirus 2 by RT PCR (hospital order, performed in [facility] hospital lab) Nasopharyngeal Nasopharyngeal Swab     Status: None   Collection Time: 01/12/20  1:26 PM   Specimen: Nasopharyngeal Swab  Result Value Ref Range Status   SARS Coronavirus 2 NEGATIVE NEGATIVE Final    Comment: (NOTE) SARS-CoV-2 target nucleic acids are NOT DETECTED.  The SARS-CoV-2 RNA is generally detectable in upper and lower respiratory specimens during the acute phase of infection. The lowest concentration of SARS-CoV-2 viral copies this assay can detect is 250 copies / mL. A negative result does not preclude SARS-CoV-2 infection and should not be used as the sole basis for treatment or other patient management decisions.  A negative result may occur with improper specimen collection  / handling, submission of specimen other than nasopharyngeal swab, presence of viral mutation(s) within the areas targeted by this assay, and inadequate number of viral copies (<250 copies / mL). A negative result must be combined with clinical observations, patient history, and epidemiological information.  Fact Sheet for Patients:   01/14/20  Fact Sheet for Healthcare Providers: BoilerBrush.com.cy  This test is not yet approved or  cleared by the https://pope.com/ FDA and has been authorized for detection and/or diagnosis of SARS-CoV-2 by FDA under an Emergency Use Authorization (EUA).  This EUA will remain in effect (meaning this test can be used) for the duration of the COVID-19 declaration under Section 564(b)(1) of the Act, 21 U.S.C. section 360bbb-3(b)(1), unless the authorization is terminated or revoked sooner.  Performed at [facility], [address] [place], [place]   Culture, blood (x 2)     Status: None (Preliminary result)   Collection Time: 01/12/20  1:56 PM   Specimen: BLOOD  Result Value Ref  Range Status   Specimen Description BLOOD BLOOD RIGHT HAND  Final   Special Requests   Final    BOTTLES DRAWN AEROBIC AND ANAEROBIC Blood Culture adequate volume   Culture   Final    NO GROWTH 2 DAYS Performed at [facility], [address] [place], [place] [address]    Report Status PENDING  Incomplete  Culture, blood (x 2)     Status: None (Preliminary result)   Collection Time: 01/12/20  1:57 PM   Specimen: BLOOD  Result Value Ref Range Status   Specimen Description BLOOD BLOOD LEFT HAND  Final   Special Requests   Final    BOTTLES DRAWN AEROBIC AND ANAEROBIC Blood Culture adequate volume   Culture   Final    NO GROWTH 2 DAYS Performed at [facility], [address], [place], [place] [address]    Report Status PENDING  Incomplete    Management plans discussed with  the patient, and she told me she needed to go home.  CODE STATUS:     Code Status Orders  (From admission, onward)         Start     Ordered   01/12/20 1335  Full code  Continuous        01/12/20 1335        Code Status History    Date Active Date Inactive Code Status Order ID Comments User Context   12/21/2019 1406 12/25/2019 0051 Full Code [id number]  Linda Hedges, [facility]   12/21/2019 1145 12/21/2019 1406 Full Code [id number]  Linda Hedges, [facility]   12/21/2019 0750 12/21/2019 1144 Full Code [id number]  Linda Hedges, CNM Inpatient   Advance Care Planning Activity      TOTAL TIME TAKING CARE OF THIS PATIENT: 34 minutes.    Loletha Grayer M.D on 01/14/2020 at 2:33 PM  Between 7am to 6pm - Pager - [phone]  After 6pm go to [email or web address] - password [email or web address]  [facility]  CC: Primary care physician; [facility] [place]

## 2020-01-17 LAB — CULTURE, BLOOD (ROUTINE X 2)
Culture: NO GROWTH
Culture: NO GROWTH
Special Requests: ADEQUATE
Special Requests: ADEQUATE

## 2020-02-26 ENCOUNTER — Other Ambulatory Visit: Payer: Self-pay

## 2020-02-26 ENCOUNTER — Encounter: Payer: Self-pay | Admitting: Emergency Medicine

## 2020-02-26 ENCOUNTER — Ambulatory Visit
Admission: EM | Admit: 2020-02-26 | Discharge: 2020-02-26 | Disposition: A | Discharge status: Home / Self Care | Payer: Medicaid Other | Attending: Family Medicine | Admitting: Family Medicine

## 2020-02-26 DIAGNOSIS — J069 Acute upper respiratory infection, unspecified: Secondary | ICD-10-CM | POA: Diagnosis present

## 2020-02-26 DIAGNOSIS — Z87891 Personal history of nicotine dependence: Secondary | ICD-10-CM | POA: Diagnosis not present

## 2020-02-26 DIAGNOSIS — Z20822 Contact with and (suspected) exposure to covid-19: Secondary | ICD-10-CM | POA: Insufficient documentation

## 2020-02-26 DIAGNOSIS — Z79899 Other long term (current) drug therapy: Secondary | ICD-10-CM | POA: Insufficient documentation

## 2020-02-26 DIAGNOSIS — E876 Hypokalemia: Secondary | ICD-10-CM | POA: Insufficient documentation

## 2020-02-26 LAB — SARS CORONAVIRUS 2 (TAT 6-24 HRS): SARS Coronavirus 2: NEGATIVE

## 2020-02-26 MED ORDER — BENZONATATE 200 MG PO CAPS: 200.0000 mg | ORAL_CAPSULE | Freq: Three times a day (TID) | ORAL | 0 refills | Status: DC | PRN

## 2020-02-26 MED ORDER — IPRATROPIUM BROMIDE 0.06 % NA SOLN: 2.0000 | Freq: Four times a day (QID) | NASAL | 0 refills | Status: DC | PRN

## 2020-02-26 NOTE — Discharge Instructions (Signed)
Medication as prescribed.  COVID test result will be available tomorrow.  Take care  Dr. Adriana Simas

## 2020-02-26 NOTE — ED Provider Notes (Signed)
[facility]    CSN: [id number] Arrival date & time: 02/26/20  2952      History   Chief Complaint Chief Complaint  Patient presents with  . Cough   HPI  24 year old female presents with cough and congestion.  Started approximately 4 days ago.  Both of her children have been sick with similar symptoms.  No fever.  No known exacerbating relieving factors.  Mother concerned about COVID-19.  Requesting testing today.  No other associated symptoms.  No other complaints at this time.  Past Medical History:  Diagnosis Date  . History of miscarriage   . Molar pregnancy     Patient Active Problem List   Diagnosis Date Noted  . Abscess 01/13/2020  . Cellulitis, perineum   . Leukocytosis   . Hypokalemia   . Sepsis (HCC) 01/12/2020  . Cellulitis of left buttock 01/12/2020  . NSVD (normal spontaneous vaginal delivery) 12/21/2019  . Preeclampsia in postpartum period 12/21/2019    Past Surgical History:  Procedure Laterality Date  . NO PAST SURGERIES      OB History    Gravida  2   Para  2   Term  2   Preterm      AB      Living  2     SAB      TAB      Ectopic      Multiple  0   Live Births  1            Home Medications    Prior to Admission medications   Medication Sig Start Date End Date Taking? Authorizing Provider  benzonatate (TESSALON) 200 MG capsule Take 1 capsule (200 mg total) by mouth 3 (three) times daily as needed for cough. 02/26/20   Everlene Other G, DO  ipratropium (ATROVENT) 0.06 % nasal spray Place 2 sprays into both nostrils 4 (four) times daily as needed for rhinitis. 02/26/20   Tommie Sams, DO  ferrous sulfate 325 (65 FE) MG tablet Take 325 mg by mouth daily with breakfast.  01/12/20  [provider]  NIFEdipine (ADALAT CC) 60 MG 24 hr tablet Take 1 tablet (60 mg total) by mouth daily. 12/25/19 01/12/20  Gustavo Lah, CNM  Potassium 99 MG TABS Take by mouth.  09/21/19  [provider]    Family  History Family History  Problem Relation Age of Onset  . Healthy Mother   . Healthy Father     Social History Social History   Tobacco Use  . Smoking status: Former Smoker    Packs/day: 0.25    Types: Cigarettes  . Smokeless tobacco: Never Used  Vaping Use  . Vaping Use: Never used  Substance Use Topics  . Alcohol use: Not Currently  . Drug use: Never     Allergies   Patient has no known allergies.   Review of Systems Review of Systems  Constitutional: Negative for fever.  HENT: Positive for congestion.   Respiratory: Positive for cough.    Physical Exam Triage Vital Signs ED Triage Vitals  Enc Vitals Group     BP 02/26/20 1024 121/89     Pulse Rate 02/26/20 1024 81     Resp 02/26/20 1024 18     Temp 02/26/20 1024 98.7 F (37.1 C)     Temp Source 02/26/20 1024 Oral     SpO2 02/26/20 1024 99 %     Weight 02/26/20 1019 172 lb 6.4 oz (78.2 kg)  Height 02/26/20 1019 5\' 5"  (1.651 m)     Head Circumference --      Peak Flow --      Pain Score 02/26/20 1019 0     Pain Loc --      Pain Edu? --      Excl. in GC? --    Updated Vital Signs BP 121/89 (BP Location: Left Arm)   Pulse 81   Temp 98.7 F (37.1 C) (Oral)   Resp 18   Ht 5\' 5"  (1.651 m)   Wt 78.2 kg   SpO2 99%   Breastfeeding No   BMI 28.69 kg/m   Visual Acuity Right Eye Distance:   Left Eye Distance:   Bilateral Distance:    Right Eye Near:   Left Eye Near:    Bilateral Near:     Physical Exam Vitals and nursing note reviewed.  Constitutional:      General: She is not in acute distress.    Appearance: Normal appearance. She is not ill-appearing.  HENT:     Head: Normocephalic and atraumatic.  Eyes:     General:        Right eye: No discharge.        Left eye: No discharge.     Conjunctiva/sclera: Conjunctivae normal.  Cardiovascular:     Rate and Rhythm: Normal rate and regular rhythm.  Pulmonary:     Effort: Pulmonary effort is normal.     Breath sounds: Normal breath  sounds. No wheezing, rhonchi or rales.  Neurological:     Mental Status: She is alert.  Psychiatric:        Mood and Affect: Mood normal.        Behavior: Behavior normal.     UC Treatments / Results  Labs (all labs ordered are listed, but only abnormal results are displayed) Labs Reviewed  SARS CORONAVIRUS 2 (TAT 6-24 HRS)    EKG   Radiology No results found.  Procedures Procedures (including critical care time)  Medications Ordered in UC Medications - No data to display  Initial Impression / Assessment and Plan / UC Course  I have reviewed the triage vital signs and the nursing notes.  Pertinent labs & imaging results that were available during my care of the patient were reviewed by me and considered in my medical decision making (see chart for details).    24 year old female presents with cough and congestion.  Treated with Tessalon Perles and Atrovent nasal spray.  Awaiting Covid test results.  Final Clinical Impressions(s) / UC Diagnoses   Final diagnoses:  Viral URI with cough     Discharge Instructions     Medication as prescribed.  COVID test result will be available tomorrow.  Take care  Dr.   ED Prescriptions    Medication Sig Dispense Auth. Provider   ipratropium (ATROVENT) 0.06 % nasal spray Place 2 sprays into both nostrils 4 (four) times daily as needed for rhinitis. 15 mL Kelin Borum G, DO   benzonatate (TESSALON) 200 MG capsule Take 1 capsule (200 mg total) by mouth 3 (three) times daily as needed for cough. 30 capsule 08-27-2003, DO     PDMP not reviewed this encounter.   09-21-1983, DO 02/26/20 1055

## 2020-02-26 NOTE — ED Triage Notes (Signed)
Pt c/o cough, nasal congestion. Started about 4 days ago. Denies fever.

## 2020-07-28 ENCOUNTER — Other Ambulatory Visit: Payer: Self-pay

## 2020-07-28 ENCOUNTER — Encounter: Payer: Self-pay | Admitting: Emergency Medicine

## 2020-07-28 ENCOUNTER — Emergency Department
Admission: EM | Admit: 2020-07-28 | Discharge: 2020-07-28 | Disposition: A | Discharge status: Home / Self Care | Payer: 59 | Attending: Emergency Medicine | Admitting: Emergency Medicine

## 2020-07-28 DIAGNOSIS — R059 Cough, unspecified: Secondary | ICD-10-CM | POA: Diagnosis present

## 2020-07-28 DIAGNOSIS — U071 COVID-19: Secondary | ICD-10-CM | POA: Diagnosis not present

## 2020-07-28 DIAGNOSIS — Z87891 Personal history of nicotine dependence: Secondary | ICD-10-CM | POA: Diagnosis not present

## 2020-07-28 DIAGNOSIS — Z20822 Contact with and (suspected) exposure to covid-19: Secondary | ICD-10-CM

## 2020-07-28 LAB — SARS CORONAVIRUS 2 (TAT 6-24 HRS): SARS Coronavirus 2: POSITIVE — AB

## 2020-07-28 NOTE — ED Notes (Signed)
No e-sig obtained due to no e-signature pad being available.  Pt verbalized understanding of all discharge instructions.

## 2020-07-28 NOTE — ED Provider Notes (Signed)
[facility] Emergency Department Provider Note  ____________________________________________   Event Date/Time   First MD Initiated Contact with Patient 07/28/20 1014     (approximate)  I have reviewed the triage vital signs    HISTORY  Chief Complaint Cough and Sore Throat    HPI Leslie Trevino is a 25 y.o. female who is otherwise healthy who comes in for multiple symptoms.  Patient states that her child's daycare had positive COVID and she has had 2 days of some headaches, cough, sore throat.  She also has said that her sister was positive for COVID.  She is here for COVID test.  Denies any abdominal pain, shortness of breath, chest pain.  Otherwise eating and drinking.  Symptoms have been 2 days, mild, nothing makes it better, nothing source          Past Medical History:  Diagnosis Date  . History of miscarriage   . Molar pregnancy     Patient Active Problem List   Diagnosis Date Noted  . Abscess 01/13/2020  . Cellulitis, perineum   . Leukocytosis   . Hypokalemia   . Sepsis (HCC) 01/12/2020  . Cellulitis of left buttock 01/12/2020  . NSVD (normal spontaneous vaginal delivery) 12/21/2019  . Preeclampsia in postpartum period 12/21/2019    Past Surgical History:  Procedure Laterality Date  . NO PAST SURGERIES      Prior to Admission medications   Medication Sig Start Date End Date Taking? Authorizing Provider  benzonatate (TESSALON) 200 MG capsule Take 1 capsule (200 mg total) by mouth 3 (three) times daily as needed for cough. 02/26/20   Everlene Other G, DO  ipratropium (ATROVENT) 0.06 % nasal spray Place 2 sprays into both nostrils 4 (four) times daily as needed for rhinitis. 02/26/20   Tommie Sams, DO  ferrous sulfate 325 (65 FE) MG tablet Take 325 mg by mouth daily with breakfast.  01/12/20  [provider]  NIFEdipine (ADALAT CC) 60 MG 24 hr tablet Take 1 tablet (60 mg total) by mouth daily. 12/25/19 01/12/20  Gustavo Lah,  CNM  Potassium 99 MG TABS Take by mouth.  09/21/19  [provider]    Allergies Patient has no known allergies.  Family History  Problem Relation Age of Onset  . Healthy Mother   . Healthy Father     Social History Social History   Tobacco Use  . Smoking status: Former Smoker    Packs/day: 0.25    Types: Cigarettes  . Smokeless tobacco: Never Used  Vaping Use  . Vaping Use: Never used  Substance Use Topics  . Alcohol use: Not Currently  . Drug use: Never      Review of Systems Constitutional: No fever/chills Eyes: No visual changes. ENT: No sore throat. Cardiovascular: Denies chest pain. Respiratory: + Cough, congestion Gastrointestinal: No abdominal pain.  No nausea, no vomiting.  No diarrhea.  No constipation. Genitourinary: Negative for dysuria. Musculoskeletal: Negative for back pain. Skin: Negative for rash. Neurological: Positive headache, focal weakness or numbness. All other ROS negative ____________________________________________   PHYSICAL EXAM:  VITAL SIGNS: ED Triage Vitals  Enc Vitals Group     BP 07/28/20 0812 116/86     Pulse Rate 07/28/20 0812 70     Resp 07/28/20 0812 20     Temp 07/28/20 0812 97.8 F (36.6 C)     Temp Source 07/28/20 0812 Oral     SpO2 07/28/20 0812 97 %     Weight  07/28/20 0814 192 lb (87.1 kg)     Height 07/28/20 0814 5\' 5"  (1.651 m)     Head Circumference --      Peak Flow --      Pain Score 07/28/20 0813 4     Pain Loc --      Pain Edu? --      Excl. in GC? --     Constitutional: Alert and oriented. Well appearing and in no acute distress. Eyes: Conjunctivae are normal. EOMI. Head: Atraumatic. Nose: No congestion/rhinnorhea. Mouth/Throat: Mucous membranes are moist.  OP clear Neck: No stridor. Trachea Midline. FROM Cardiovascular: Normal rate, regular rhythm. Grossly normal heart sounds.  Good peripheral circulation. Respiratory: no audible stridor, work of breathing  Gastrointestinal: Soft and  nontender. No distention. No abdominal bruits.  Musculoskeletal: No lower extremity tenderness nor edema.  No joint effusions. Neurologic:  Normal speech and language. No gross focal neurologic deficits are appreciated.  Skin:  Skin is warm, dry and intact. No rash noted. Psychiatric: Mood and affect are normal. Speech and behavior are normal. GU: Deferred   ____________________________________________   LABS (all labs ordered are listed, but only abnormal results are displayed)  Labs Reviewed  SARS CORONAVIRUS 2 (TAT 6-24 HRS)   ____________________________________________   PROCEDURES  Procedure(s) performed (including Critical Care):  Procedures   ____________________________________________   INITIAL IMPRESSION / ASSESSMENT AND PLAN / ED COURSE  Parrie Rasco was evaluated in Emergency Department on 07/28/2020 for the symptoms described in the history of present illness. She was evaluated in the context of the global COVID-19 pandemic, which necessitated consideration that the patient might be at risk for infection with the SARS-CoV-2 virus that causes COVID-19. Institutional protocols and algorithms that pertain to the evaluation of patients at risk for COVID-19 are in a state of rapid change based on information released by regulatory bodies including the CDC and federal and state organizations. These policies and algorithms were followed during the patient's care in the ED.     Pt presents with cough, headache, sore throat.  Suspect could be secondary to COVID.  No evidence of strep on exam.  Patient otherwise well-appearing with normal oxygen levels.  Discussed symptomatic treatment.  Otherwise healthy.  Discussed return precautions related to COVID  I discussed the provisional nature of ED diagnosis, the treatment so far, the ongoing plan of care, follow up appointments and return precautions with the patient and any family or support people present. They expressed  understanding and agreed with the plan, discharged home.      ____________________________________________   FINAL CLINICAL IMPRESSION(S) / ED DIAGNOSES   Final diagnoses:  Close exposure to COVID-19 virus      MEDICATIONS GIVEN DURING THIS VISIT:  Medications - No data to display   ED Discharge Orders    None       Note:  This document was prepared using Dragon voice recognition software and may include unintentional dictation errors.   09/25/2020, MD 07/28/20 1017

## 2020-07-28 NOTE — ED Notes (Signed)
BW B

## 2020-07-28 NOTE — Discharge Instructions (Addendum)
Follow-up on MyChart for your results.  Take Tylenol ibuprofen to help with your symptoms.  Return to the ER for worsening shortness of breath or any other concern

## 2020-07-28 NOTE — ED Triage Notes (Signed)
Pt to ED via POV with 2 children who are also patients, pt reports HA, cough, sore throat x several days. Pt reports recent exposure to sister who is covid positive.

## 2020-09-06 DIAGNOSIS — F3340 Major depressive disorder, recurrent, in remission, unspecified: Secondary | ICD-10-CM | POA: Insufficient documentation

## 2021-11-27 ENCOUNTER — Ambulatory Visit
Admission: EM | Admit: 2021-11-27 | Discharge: 2021-11-27 | Disposition: A | Discharge status: Home / Self Care | Payer: Medicaid Other | Attending: Internal Medicine | Admitting: Internal Medicine

## 2021-11-27 DIAGNOSIS — A599 Trichomoniasis, unspecified: Secondary | ICD-10-CM | POA: Diagnosis present

## 2021-11-27 DIAGNOSIS — B9689 Other specified bacterial agents as the cause of diseases classified elsewhere: Secondary | ICD-10-CM | POA: Insufficient documentation

## 2021-11-27 DIAGNOSIS — N760 Acute vaginitis: Secondary | ICD-10-CM | POA: Insufficient documentation

## 2021-11-27 HISTORY — DX: Other specified behavioral and emotional disorders with onset usually occurring in childhood and adolescence: F98.8

## 2021-11-27 LAB — URINALYSIS, ROUTINE W REFLEX MICROSCOPIC: Bilirubin Urine: NEGATIVE

## 2021-11-27 LAB — URINALYSIS, MICROSCOPIC (REFLEX): WBC, UA: >50 WBC/hpf (ref 0–5)

## 2021-11-27 LAB — WET PREP, GENITAL
Sperm: NONE SEEN
WBC, Wet Prep HPF POC: >=10 — AB (ref <10)
Yeast Wet Prep HPF POC: NONE SEEN

## 2021-11-27 MED ORDER — METRONIDAZOLE 500 MG PO TABS: 500.0000 mg | ORAL_TABLET | Freq: Two times a day (BID) | ORAL | 0 refills | Status: DC

## 2021-11-27 NOTE — Discharge Instructions (Addendum)
Today you are being treated prophylactically for  Bacterial vaginosis  ? ?Take Metronidazole 500 mg twice a day for 7 days, do not drink alcohol while using medication, this will make you feel sick  ? ?Bacterial vaginosis which results from an overgrowth of one on several organisms that are normally present in your vagina. Vaginosis is an inflammation of the vagina that can result in discharge, itching and pain. ? ?Labs pending 2-3 days, you will be contacted if positive for any sti and treatment will be sent to the pharmacy, you will have to return to the clinic if positive for gonorrhea to receive treatment  ? ?Please refrain from having sex until labs results, if positive please refrain from having sex until treatment complete and symptoms resolve  ? ?If positive for , Chlamydia  gonorrhea please notify partner or partners so they may tested as well ? ?Moving forward, it is recommended you use some form of protection against the transmission of sti infections  such as condoms or dental dams with each sexual encounter   ? ? ?In addition: ?Avoid baths, hot tubs and whirlpool spas.  ?Don't use scented or harsh soaps ?Avoid irritants. These include scented tampons and pads. ?Wipe from front to back after using the toilet. ?Don't douche. Your vagina doesn't require cleansing other than normal bathing.  ?Use a condom.  ?Wear cotton underwear, this fabric absorbs some moisture.  ? ? ? ? ?

## 2021-11-27 NOTE — ED Provider Notes (Addendum)
?[facility] ? ? ? ?CSN: [id number] ?Arrival date & time: 11/27/21  8938 ? ? ?  ? ?History   ?Chief Complaint ?Chief Complaint  ?Patient presents with  ? Vaginal Itching  ? ? ?HPI ?Leslie Trevino is a 26 y.o. female.  ? ?Presents with vaginal itching and urinary frequency for 2 weeks.  Has attempted use of Azo which has been ineffective.  Endorses that she received STI testing at a [facility] facility on 11/09/2021, all testing negative.  No known exposures.  Currently on menses.  Denies hematuria, dysuria, lower abdominal pain or pressure, flank pain, fever or chills, new rash or lesions, vaginal discharge, vaginal or urinary odor. ? ?Past Medical History:  ?Diagnosis Date  ? ADD (attention deficit disorder)   ? History of miscarriage   ? Molar pregnancy   ? ? ?Patient Active Problem List  ? Diagnosis Date Noted  ? Abscess 01/13/2020  ? Cellulitis, perineum   ? Leukocytosis   ? Hypokalemia   ? Sepsis (HCC) 01/12/2020  ? Cellulitis of left buttock 01/12/2020  ? NSVD (normal spontaneous vaginal delivery) 12/21/2019  ? Preeclampsia in postpartum period 12/21/2019  ? ? ?Past Surgical History:  ?Procedure Laterality Date  ? NO PAST SURGERIES    ? ? ?OB History   ? ? Gravida  ?2  ? Para  ?2  ? Term  ?2  ? Preterm  ?   ? AB  ?   ? Living  ?2  ?  ? ? SAB  ?   ? IAB  ?   ? Ectopic  ?   ? Multiple  ?0  ? Live Births  ?1  ?   ?  ?  ? ? ? ?Home Medications   ? ?Prior to Admission medications   ?Medication Sig Start Date End Date Taking? Authorizing Provider  ?lisdexamfetamine (VYVANSE) 70 MG capsule Take by mouth. 11/09/21  Yes [provider]  ?benzonatate (TESSALON) 200 MG capsule Take 1 capsule (200 mg total) by mouth 3 (three) times daily as needed for cough. 02/26/20   Tommie Sams, DO  ?ipratropium (ATROVENT) 0.06 % nasal spray Place 2 sprays into both nostrils 4 (four) times daily as needed for rhinitis. 02/26/20   Tommie Sams, DO  ?ferrous sulfate 325 (65 FE) MG tablet Take 325 mg by mouth daily with  breakfast.  01/12/20  [provider]  ?NIFEdipine (ADALAT CC) 60 MG 24 hr tablet Take 1 tablet (60 mg total) by mouth daily. 12/25/19 01/12/20  Gustavo Lah, CNM  ?Potassium 99 MG TABS Take by mouth.  09/21/19  [provider]  ? ? ?Family History ?Family History  ?Problem Relation Age of Onset  ? Healthy Mother   ? Healthy Father   ? ? ?Social History ?Social History  ? ?Tobacco Use  ? Smoking status: Some Days  ?  Packs/day: 0.25  ?  Types: Cigarettes  ? Smokeless tobacco: Never  ?Vaping Use  ? Vaping Use: Some days  ?Substance Use Topics  ? Alcohol use: Yes  ?  Comment: occasional  ? Drug use: Never  ? ? ? ?Allergies   ?Patient has no known allergies. ? ? ?Review of Systems ?Review of Systems  ?Constitutional: Negative.   ?Respiratory: Negative.    ?Cardiovascular: Negative.   ?Genitourinary:  Positive for frequency. Negative for decreased urine volume, difficulty urinating, dyspareunia, dysuria, enuresis, flank pain, genital sores, hematuria, menstrual problem, pelvic pain, urgency, vaginal bleeding, vaginal discharge and vaginal pain.  ?  Skin: Negative.   ? ? ?Physical Exam ?Triage Vital Signs ?ED Triage Vitals  ?Enc Vitals Group  ?   BP 11/27/21 0844 106/76  ?   Pulse Rate 11/27/21 0844 73  ?   Resp 11/27/21 0844 16  ?   Temp 11/27/21 0844 98.3 ?F (36.8 ?C)  ?   Temp Source 11/27/21 0844 Oral  ?   SpO2 11/27/21 0844 99 %  ?   Weight 11/27/21 0840 179 lb (81.2 kg)  ?   Height 11/27/21 0840 5\' 5"  (1.651 m)  ?   Head Circumference --   ?   Peak Flow --   ?   Pain Score 11/27/21 0840 0  ?   Pain Loc --   ?   Pain Edu? --   ?   Excl. in GC? --   ? ?No data found. ? ?Updated Vital Signs ?BP 106/76 (BP Location: Left Arm)   Pulse 73   Temp 98.3 ?F (36.8 ?C) (Oral)   Resp 16   Ht 5\' 5"  (1.651 m)   Wt 179 lb (81.2 kg)   LMP 11/06/2021   SpO2 99%   BMI 29.79 kg/m?  ? ?Visual Acuity ?Right Eye Distance:   ?Left Eye Distance:   ?Bilateral Distance:   ? ?Right Eye Near:   ?Left Eye Near:    ?Bilateral  Near:    ? ?Physical Exam ?Constitutional:   ?   Appearance: Normal appearance.  ?HENT:  ?   Head: Normocephalic.  ?Eyes:  ?   Extraocular Movements: Extraocular movements intact.  ?Pulmonary:  ?   Effort: Pulmonary effort is normal.  ?Genitourinary: ?   Comments: Deferred ?Neurological:  ?   Mental Status: She is alert and oriented to person, place, and time. Mental status is at baseline.  ?Psychiatric:     ?   Mood and Affect: Mood normal.     ?   Behavior: Behavior normal.  ? ? ? ?UC Treatments / Results  ?Labs ?(all labs ordered are listed, but only abnormal results are displayed) ?Labs Reviewed  ?WET PREP, GENITAL  ?URINALYSIS, ROUTINE W REFLEX MICROSCOPIC  ? ? ?EKG ? ? ?Radiology ?No results found. ? ?Procedures ?Procedures (including critical care time) ? ?Medications Ordered in UC ?Medications - No data to display ? ?Initial Impression / Assessment and Plan / UC Course  ?I have reviewed the triage vital signs and the nursing notes. ? ?Pertinent labs & imaging results that were available during my care of the patient were reviewed by me and considered in my medical decision making (see chart for details). ? ?Trichomoniasis  ?Bacterial vaginosis ? ?Discussed findings with patient, confirmed via wet prep, urinalysis skewed by use of Azo, sent for culture, gonorrhea and chlamydia swab pending, will treat per protocol, metronidazole 7-day course prescribed for treatment, advise discontinuation of any alcohol use while on medication, may follow-up with urgent care as needed for persisting or reoccurring symptoms ?Final Clinical Impressions(s) / UC Diagnoses  ? ?Final diagnoses:  ?None  ? ?Discharge Instructions   ?None ?  ? ?ED Prescriptions   ?None ?  ? ?PDMP not reviewed this encounter. ?  ? , NP ?11/27/21 Valinda Hoar ? ?  ?01/27/22, NP ?11/27/21 Valinda Hoar ? ?

## 2021-11-27 NOTE — ED Triage Notes (Signed)
Pt with vaginal itching for 2 weeks. Had STD testing done last week and was negative. Still itching. Would like to also have her urine tested due to it being darker than usual.  ?

## 2021-11-28 LAB — CERVICOVAGINAL ANCILLARY ONLY
Chlamydia: NEGATIVE
Comment: NEGATIVE
Comment: NORMAL
Neisseria Gonorrhea: NEGATIVE

## 2021-11-29 ENCOUNTER — Telehealth (HOSPITAL_COMMUNITY): Payer: Self-pay | Admitting: Emergency Medicine

## 2021-11-29 LAB — URINE CULTURE: Culture: >=100000 — AB

## 2021-11-29 MED ORDER — NITROFURANTOIN MONOHYD MACRO 100 MG PO CAPS: 100.0000 mg | ORAL_CAPSULE | Freq: Two times a day (BID) | ORAL | 0 refills | Status: DC

## 2022-02-01 ENCOUNTER — Encounter: Payer: Self-pay | Admitting: Emergency Medicine

## 2022-02-01 ENCOUNTER — Ambulatory Visit
Admission: EM | Admit: 2022-02-01 | Discharge: 2022-02-01 | Disposition: A | Discharge status: Home / Self Care | Payer: Medicaid Other | Attending: Internal Medicine | Admitting: Internal Medicine

## 2022-02-01 ENCOUNTER — Other Ambulatory Visit: Payer: Self-pay

## 2022-02-01 DIAGNOSIS — Z113 Encounter for screening for infections with a predominantly sexual mode of transmission: Secondary | ICD-10-CM | POA: Insufficient documentation

## 2022-02-01 DIAGNOSIS — R350 Frequency of micturition: Secondary | ICD-10-CM | POA: Diagnosis present

## 2022-02-01 LAB — WET PREP, GENITAL
Clue Cells Wet Prep HPF POC: NONE SEEN
Sperm: NONE SEEN
Trich, Wet Prep: NONE SEEN
WBC, Wet Prep HPF POC: <10 (ref <10)
Yeast Wet Prep HPF POC: NONE SEEN

## 2022-02-01 LAB — URINALYSIS, ROUTINE W REFLEX MICROSCOPIC
Bilirubin Urine: NEGATIVE
Glucose, UA: NEGATIVE mg/dL
Hgb urine dipstick: NEGATIVE
Ketones, ur: NEGATIVE mg/dL
Leukocytes,Ua: NEGATIVE
Nitrite: NEGATIVE
Protein, ur: NEGATIVE mg/dL
Specific Gravity, Urine: 1.02 (ref 1.005–1.030)
pH: 7 (ref 5.0–8.0)

## 2022-02-01 IMAGING — US US OB LIMITED
1 series · 14 of 28 positions shown · non-contrast
Comparison: none

CLINICAL DATA: 23-year-old pregnant female with abdominal pain.

EXAM:
LIMITED OBSTETRIC ULTRASOUND

[Series 1: us ob limited · 31 acquisitions, 14 frames shown]
[im 2/31]
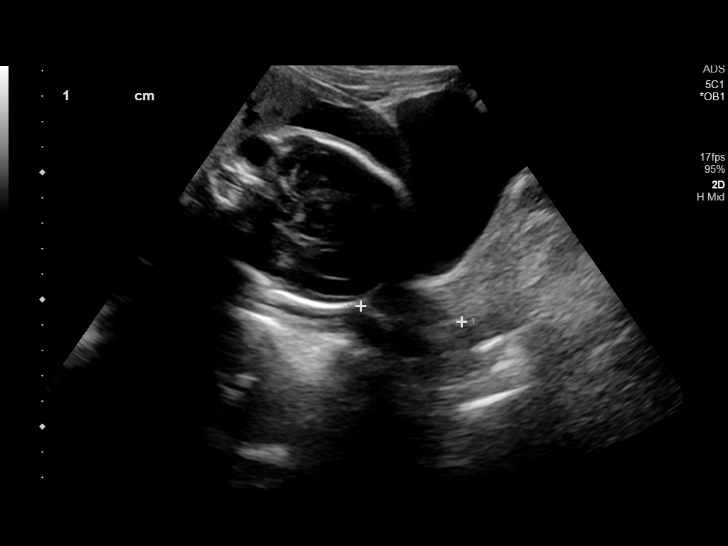
[im 4/31]
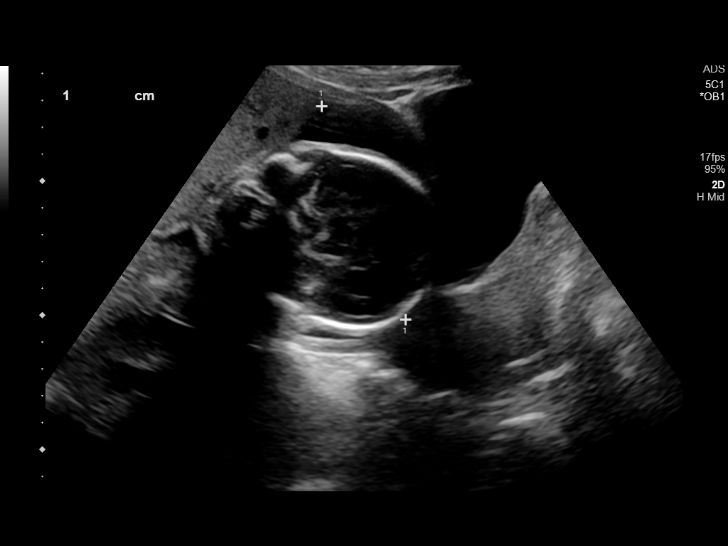
[im 6/31]
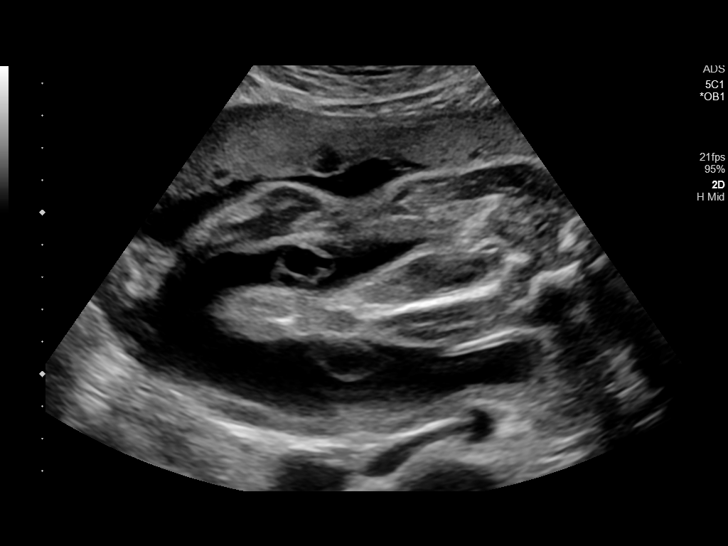
[im 8/31]
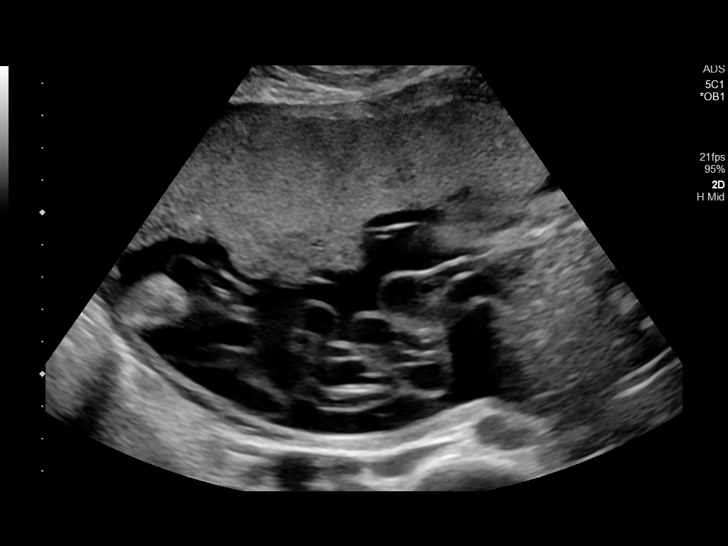
[im 11/31]
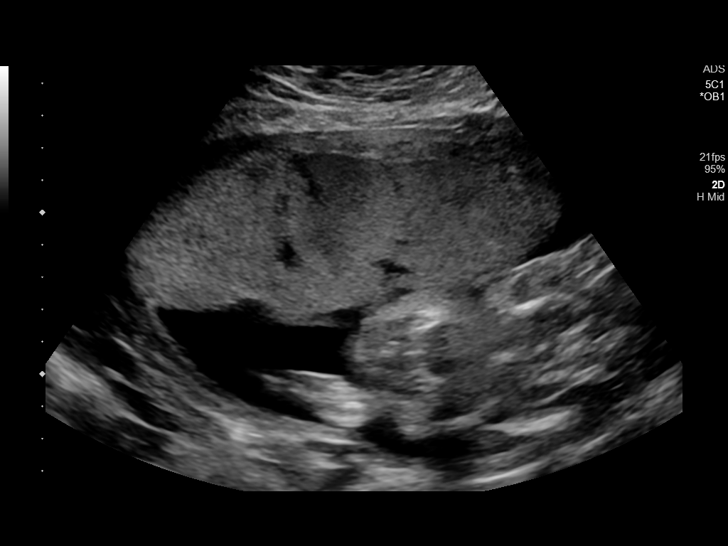
[im 13/31]
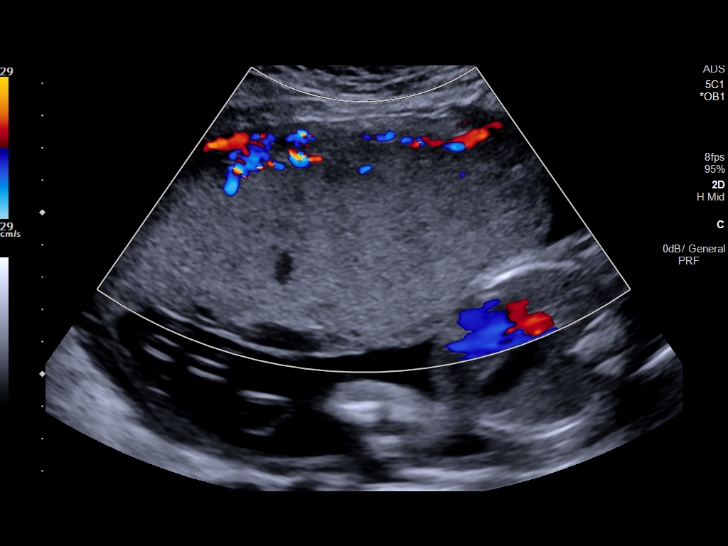
[im 15/31]
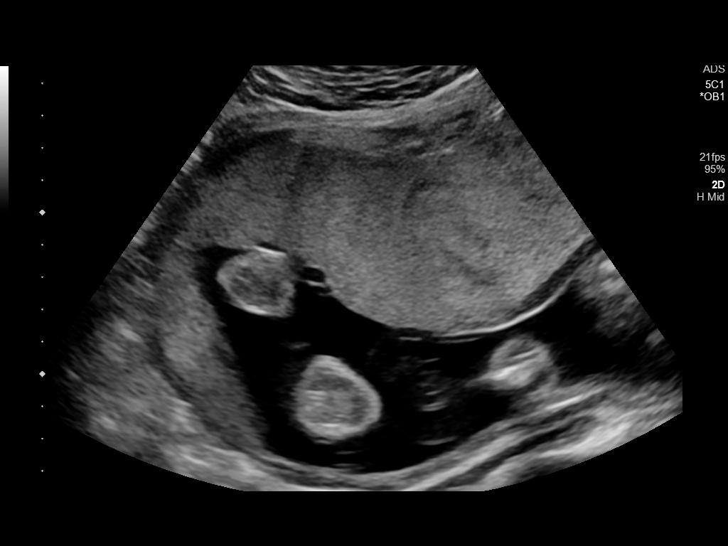
[im 17/31]
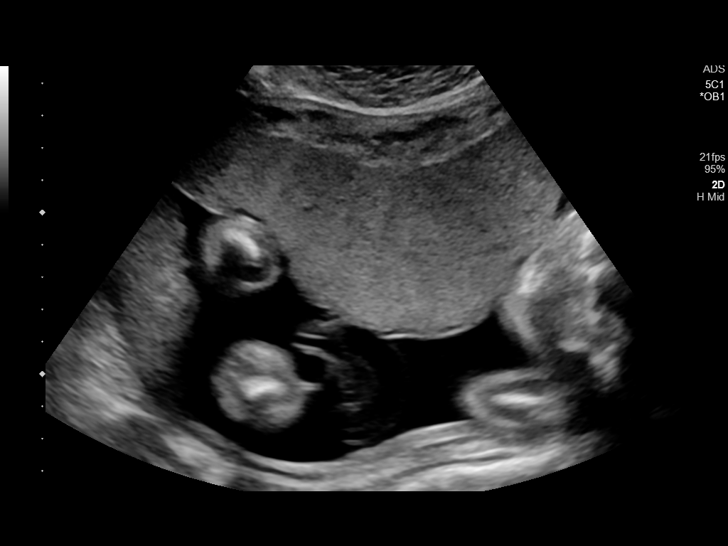
[im 19/31]
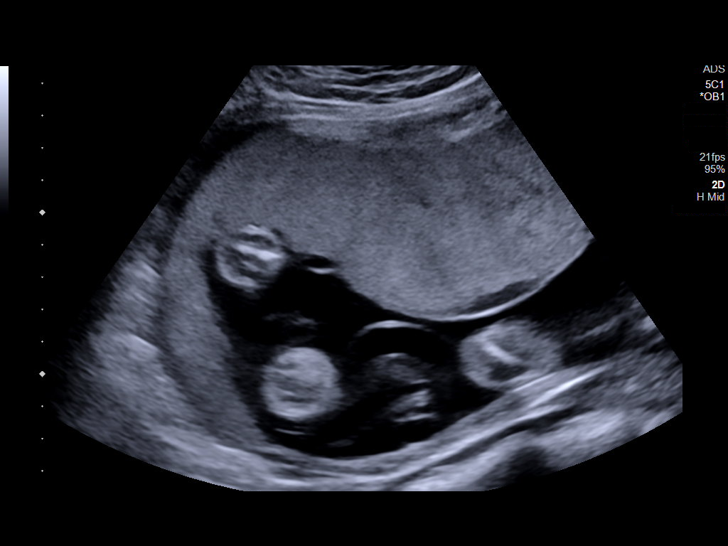
[im 22/31]
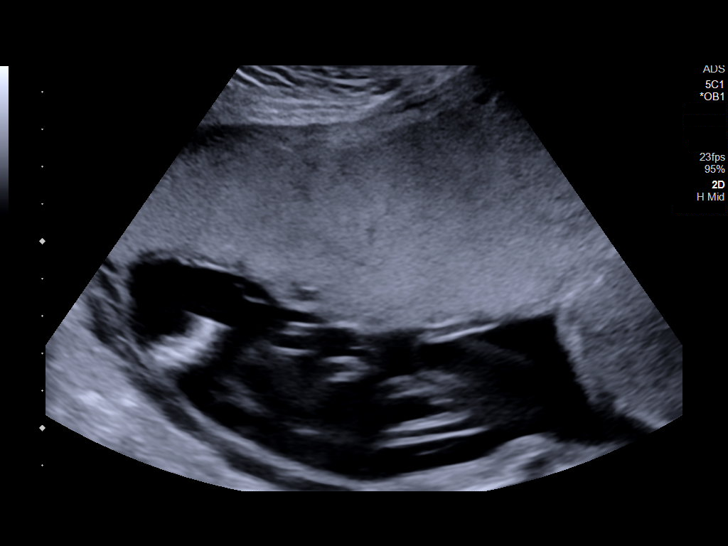
[im 24/31]
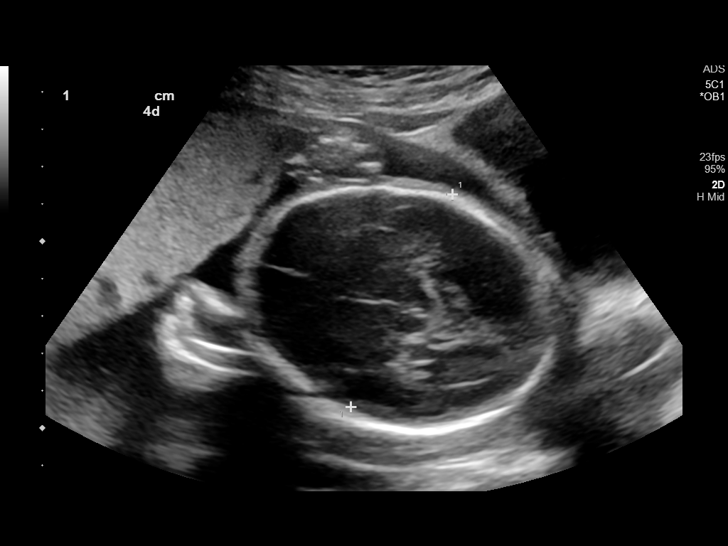
[im 26/31]
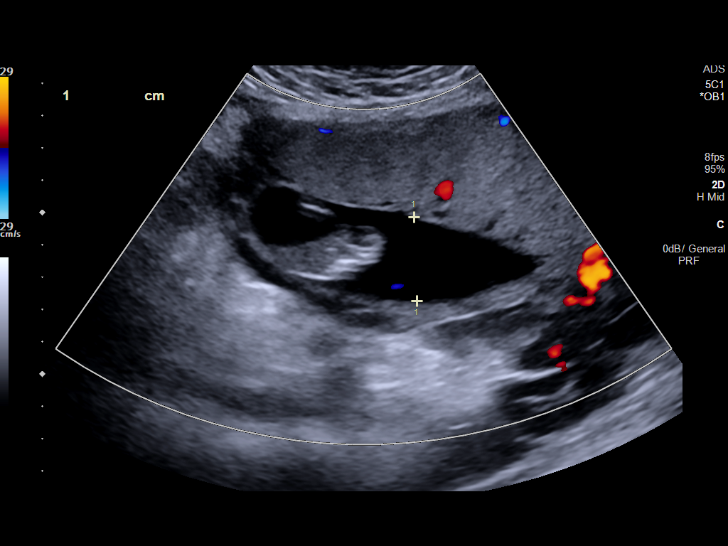
[im 28/31]
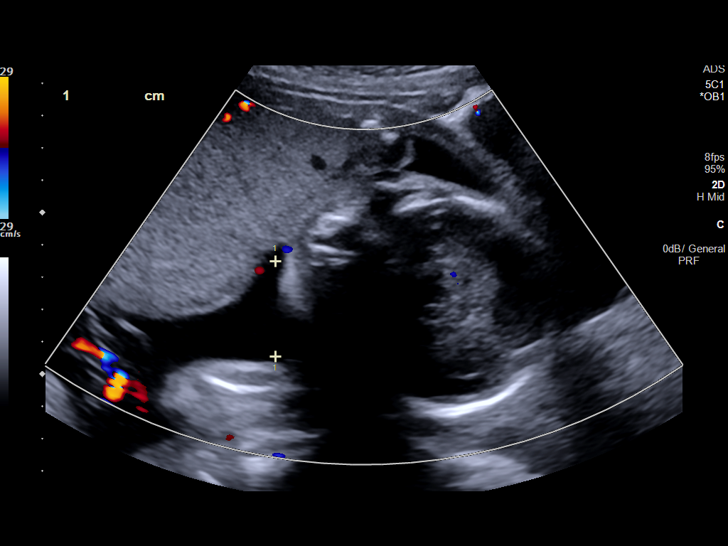
[im 31/31]
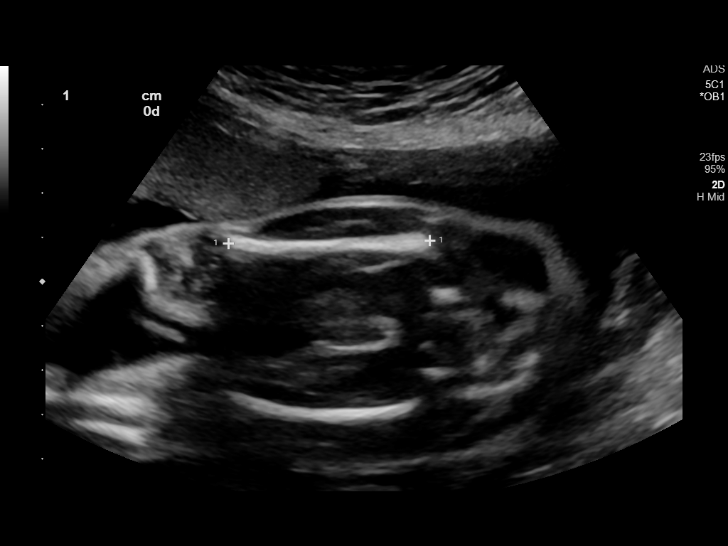

[14 of 28 positions shown; findings below may reference images not displayed]

FINDINGS: Number of Fetuses: 1

Heart Rate:  133 bpm

Movement: Detected

Presentation: Cephalic

Placental Location: Anterior fundal. The main disc of placenta is
anterior. Smaller portion of the placenta in the fundus may be
contiguous or represent a succenturiate lobe.

Previa: No

Amniotic Fluid (Subjective):  Within normal limits.

AFI: 15 cm

BPD: 6.3 cm 25 w  4 d

MATERNAL FINDINGS:

Cervix:  Appears closed.

Uterus/Adnexae: No abnormality visualized.
IMPRESSION: Single live intrauterine pregnancy with an estimated gestational age
of 25 weeks, 4 days based on biparietal diameter. No acute
abnormality.

This exam is performed on an emergent basis and does not
comprehensively evaluate fetal size, dating, or anatomy; follow-up
complete OB US should be considered if further fetal assessment is
warranted.

## 2022-02-01 NOTE — ED Provider Notes (Signed)
[facility]    CSN: [id number] Arrival date & time: 02/01/22  2505      History   Chief Complaint Chief Complaint  Patient presents with   Urinary Frequency   Exposure to STD    HPI Leslie Trevino is a 26 y.o. female who presents with onset of dysuria and frequency x 5 days. She feels the burning on her vagina and not urethra. Denies sores on her labia. She also would like to have STD testing since her current partner has given her Ivery Quale in the past.  She is on Depo and does not get periods   Past Medical History:  Diagnosis Date   ADD (attention deficit disorder)    History of miscarriage    Molar pregnancy     Patient Active Problem List   Diagnosis Date Noted   Abscess 01/13/2020   Cellulitis, perineum    Leukocytosis    Hypokalemia    Sepsis (HCC) 01/12/2020   Cellulitis of left buttock 01/12/2020   NSVD (normal spontaneous vaginal delivery) 12/21/2019   Preeclampsia in postpartum period 12/21/2019    Past Surgical History:  Procedure Laterality Date   NO PAST SURGERIES      OB History     Gravida  2   Para  2   Term  2   Preterm      AB      Living  2      SAB      IAB      Ectopic      Multiple  0   Live Births  1            Home Medications    Prior to Admission medications   Medication Sig Start Date End Date Taking? Authorizing Provider  buPROPion (WELLBUTRIN XL) 300 MG 24 hr tablet Take by mouth. 12/11/21 12/11/22 Yes [provider]  lisdexamfetamine (VYVANSE) 70 MG capsule Take by mouth. 11/09/21  Yes [provider]  ferrous sulfate 325 (65 FE) MG tablet Take 325 mg by mouth daily with breakfast.  01/12/20  [provider]  NIFEdipine (ADALAT CC) 60 MG 24 hr tablet Take 1 tablet (60 mg total) by mouth daily. 12/25/19 01/12/20  Gustavo Lah, CNM  Potassium 99 MG TABS Take by mouth.  09/21/19  [provider]    Family History Family History  Problem Relation Age of Onset    Healthy Mother    Healthy Father     Social History Social History   Tobacco Use   Smoking status: Some Days    Packs/day: 0.25    Types: Cigarettes   Smokeless tobacco: Never  Vaping Use   Vaping Use: Former  Substance Use Topics   Alcohol use: Yes    Comment: occasional   Drug use: Never     Allergies   Patient has no known allergies.   Review of Systems Review of Systems  Genitourinary:  Positive for dysuria and frequency. Negative for difficulty urinating, flank pain, genital sores, pelvic pain, vaginal discharge and vaginal pain.     Physical Exam Triage Vital Signs ED Triage Vitals  Enc Vitals Group     BP 02/01/22 0846 118/64     Pulse Rate 02/01/22 0846 74     Resp 02/01/22 0846 16     Temp 02/01/22 0846 98.2 F (36.8 C)     Temp Source 02/01/22 0846 Oral     SpO2 02/01/22 0846 100 %  Weight 02/01/22 0843 179 lb 0.2 oz (81.2 kg)     Height 02/01/22 0843 5\' 5"  (1.651 m)     Head Circumference --      Peak Flow --      Pain Score 02/01/22 0843 0     Pain Loc --      Pain Edu? --      Excl. in GC? --    No data found.  Updated Vital Signs BP 118/64 (BP Location: Left Arm)   Pulse 74   Temp 98.2 F (36.8 C) (Oral)   Resp 16   Ht 5\' 5"  (1.651 m)   Wt 179 lb 0.2 oz (81.2 kg)   SpO2 100%   BMI 29.79 kg/m   Visual Acuity Right Eye Distance:   Left Eye Distance:   Bilateral Distance:    Right Eye Near:   Left Eye Near:    Bilateral Near:     Physical Exam Vitals and nursing note reviewed.  Constitutional:      General: She is not in acute distress.    Appearance: She is normal weight.  HENT:     Right Ear: External ear normal.     Left Ear: External ear normal.  Eyes:     General: No scleral icterus.    Conjunctiva/sclera: Conjunctivae normal.  Pulmonary:     Effort: Pulmonary effort is normal.  Musculoskeletal:        General: Normal range of motion.     Cervical back: Neck supple.  Neurological:     Mental Status: She  is alert and oriented to person, place, and time.     Gait: Gait normal.  Psychiatric:        Mood and Affect: Mood normal.        Behavior: Behavior normal.        Thought Content: Thought content normal.        Judgment: Judgment normal.      UC Treatments / Results  Labs (all labs ordered are listed, but only abnormal results are displayed) Labs Reviewed  URINALYSIS, ROUTINE W REFLEX MICROSCOPIC - Abnormal; Notable for the following components:      Result Value   APPearance HAZY (*)    All other components within normal limits  WET PREP, GENITAL  CERVICOVAGINAL ANCILLARY ONLY  Wet prep is negative UA negative  EKG   Radiology No results found.  Procedures Procedures (including critical care time)  Medications Ordered in UC Medications - No data to display  Initial Impression / Assessment and Plan / UC Course  I have reviewed the triage vital signs and the nursing notes.  Pertinent labs results that were available during my care of the patient were reviewed by me and considered in my medical decision making (see chart for details).   GC/Chlamydia test ordered.  We will inform her of the results.   Final Clinical Impressions(s) / UC Diagnoses   Final diagnoses:  Screen for STD (sexually transmitted disease)  Urinary frequency     Discharge Instructions      We will call you if the STD test is positive      ED Prescriptions   None    PDMP not reviewed this encounter.   02/03/22, 02/01/22 [phone]

## 2022-02-01 NOTE — Discharge Instructions (Addendum)
We will call you if the STD test is positive

## 2022-02-01 NOTE — ED Triage Notes (Signed)
Pt c/o urinary frequency, and dysuria. Started about 5 days ago. She states she would also like to be tested for STDs. Denies symptoms or exposure.

## 2022-02-02 LAB — CERVICOVAGINAL ANCILLARY ONLY
Chlamydia: NEGATIVE
Comment: NEGATIVE
Comment: NORMAL
Neisseria Gonorrhea: NEGATIVE

## 2022-02-23 ENCOUNTER — Ambulatory Visit
Admission: EM | Admit: 2022-02-23 | Discharge: 2022-02-23 | Disposition: A | Discharge status: Home / Self Care | Payer: Medicaid Other | Attending: Physician Assistant | Admitting: Physician Assistant

## 2022-02-23 DIAGNOSIS — N390 Urinary tract infection, site not specified: Secondary | ICD-10-CM

## 2022-02-23 DIAGNOSIS — Z202 Contact with and (suspected) exposure to infections with a predominantly sexual mode of transmission: Secondary | ICD-10-CM

## 2022-02-23 LAB — URINALYSIS, ROUTINE W REFLEX MICROSCOPIC
Bilirubin Urine: NEGATIVE
Glucose, UA: NEGATIVE mg/dL
Hgb urine dipstick: NEGATIVE
Ketones, ur: 40 mg/dL — AB
Leukocytes,Ua: NEGATIVE
Nitrite: POSITIVE — AB
Protein, ur: 30 mg/dL — AB
Specific Gravity, Urine: 1.025 (ref 1.005–1.030)
pH: 6 (ref 5.0–8.0)

## 2022-02-23 LAB — WET PREP, GENITAL
Clue Cells Wet Prep HPF POC: NONE SEEN
Sperm: NONE SEEN
Trich, Wet Prep: NONE SEEN
WBC, Wet Prep HPF POC: <10 — AB (ref <10)

## 2022-02-23 LAB — URINALYSIS, MICROSCOPIC (REFLEX): RBC / HPF: NONE SEEN RBC/hpf (ref 0–5)

## 2022-02-23 MED ORDER — NITROFURANTOIN MONOHYD MACRO 100 MG PO CAPS: 100.0000 mg | ORAL_CAPSULE | Freq: Two times a day (BID) | ORAL | 0 refills | Status: DC

## 2022-02-23 MED ORDER — FLUCONAZOLE 200 MG PO TABS: 200.0000 mg | ORAL_TABLET | Freq: Once | ORAL | 1 refills | Status: AC

## 2022-02-23 NOTE — Discharge Instructions (Addendum)
Take the Macrobid twice daily for 5 days with food for treatment of urinary tract infection.  Take the fluconazole 200mg  one time. May repeat in 7 days if needed.  Increase your oral fluid intake so that you increase your urine production and or flushing your urinary system.  We will culture urine and change the antibiotics if necessary.  Return for reevaluation, or see your primary care provider, for any new or worsening symptoms.  We will call you with results of other testing.
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
## 2022-02-25 LAB — URINE CULTURE: Culture: >=100000 — AB

## 2022-02-26 ENCOUNTER — Telehealth: Payer: Self-pay

## 2022-02-26 NOTE — Telephone Encounter (Signed)
Was able to reach patient, she is very anxious and states she will return ASAP for recollect

## 2022-02-26 NOTE — Telephone Encounter (Signed)
Tamika from the lab called and stated that a specimen from 02/23/2022 needed to be recollected due to no name on the specimen.   Called patient to make her aware that her specimen will need to be recollected.   No answer. VM was full.

## 2022-05-25 IMAGING — CT CT PELVIS W/ CM
2 of 3 series · 17 of 46 positions shown, 19 images · IV contrast (omnipaque)
Comparison: None.

CLINICAL DATA: Abscess of anal and rectal region.

EXAM:
CT PELVIS WITH CONTRAST
TECHNIQUE: Multidetector CT imaging of the pelvis was performed using the
standard protocol following the bolus administration of intravenous
contrast.
CONTRAST:  100mL OMNIPAQUE IOHEXOL 300 MG/ML  SOLN

[Series 2: axial st · axial · 0.75mm/px · z∈[-1195,-920]mm · 14 of 65 slices shown, 16 images]
[im 5/65  soft-tissue]
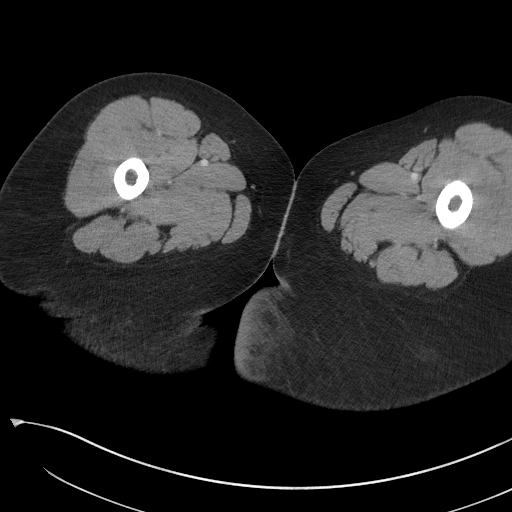
[im 5/65  bone]
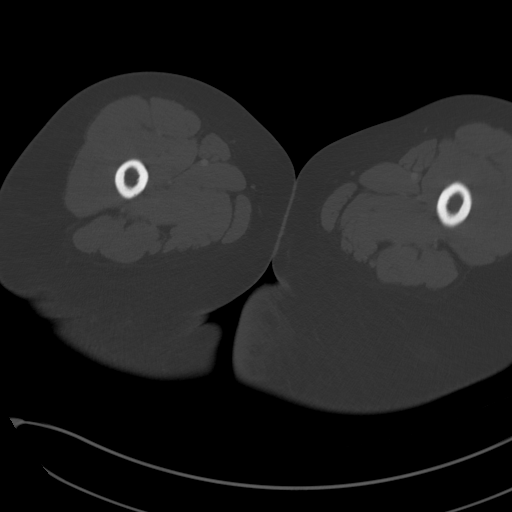
[im 9/65  soft-tissue]
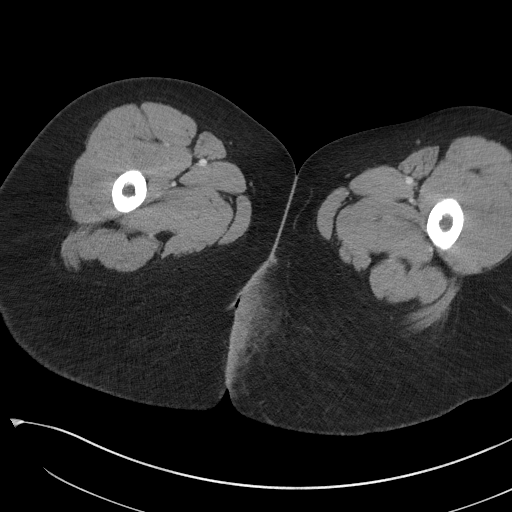
[im 13/65  soft-tissue]
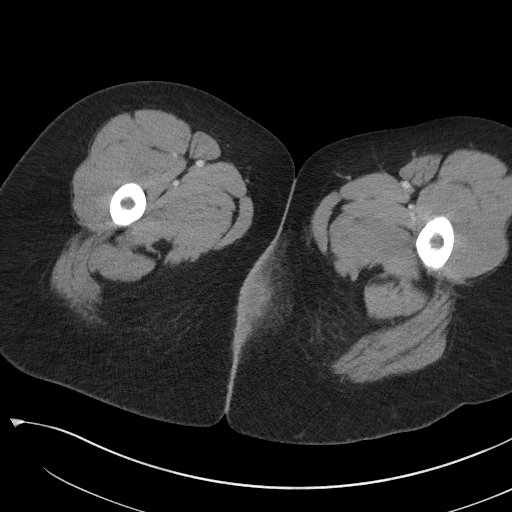
[im 17/65  soft-tissue]
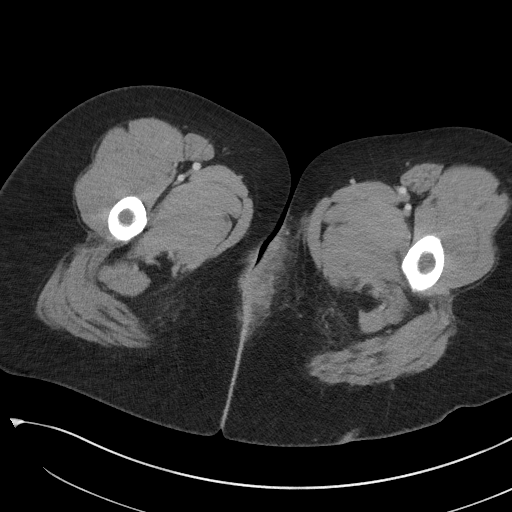
[im 21/65  soft-tissue]
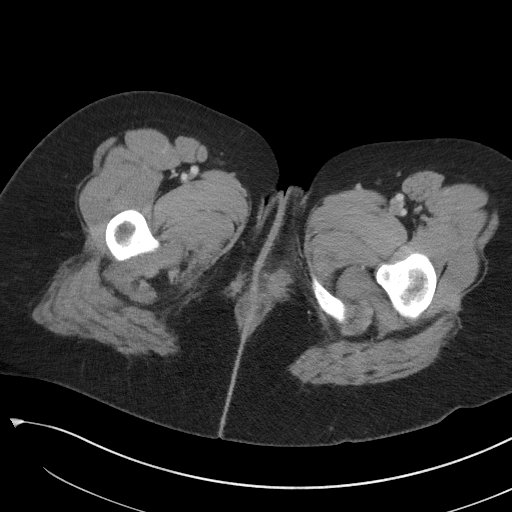
[im 25/65  soft-tissue]
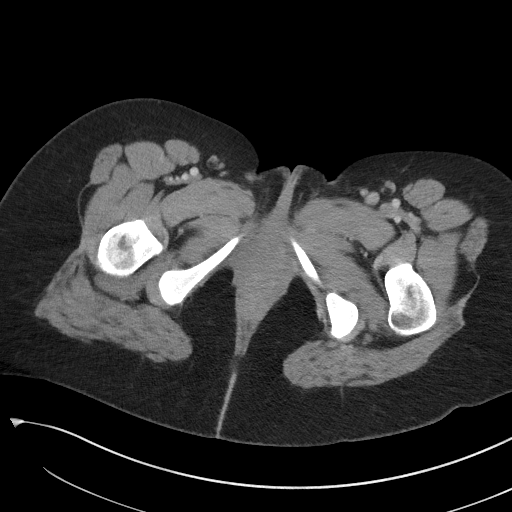
[im 29/65  soft-tissue]
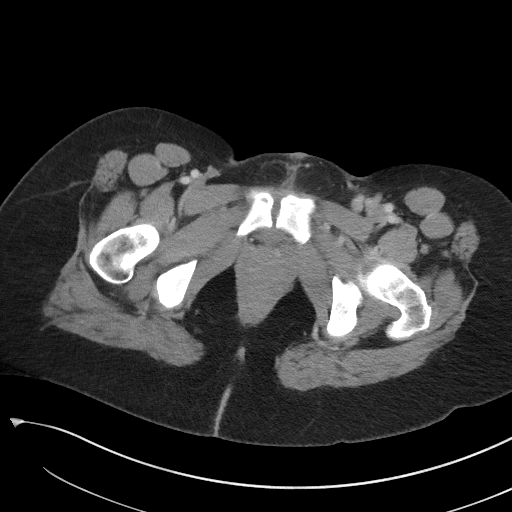
[im 36/65  soft-tissue]
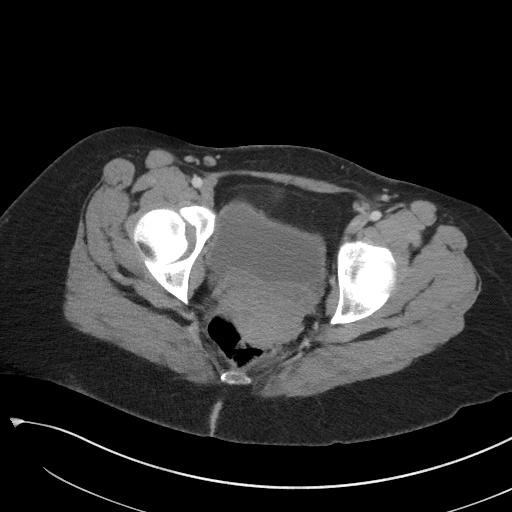
[im 40/65  soft-tissue]
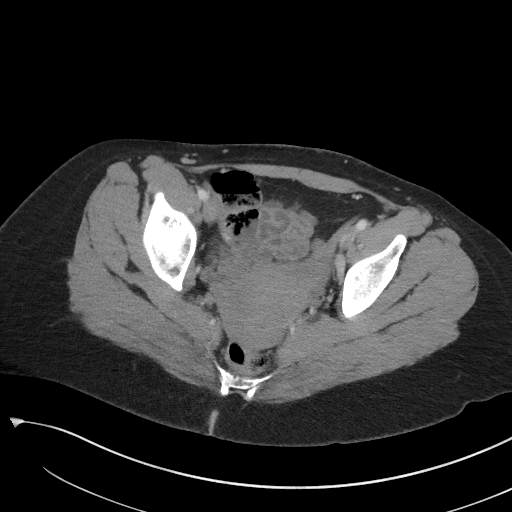
[im 40/65  bone]
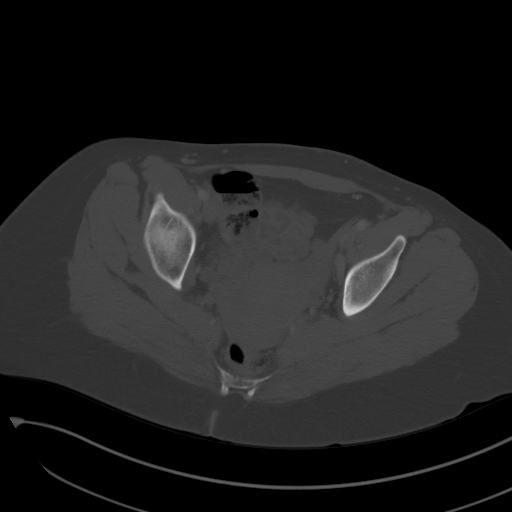
[im 44/65  soft-tissue]
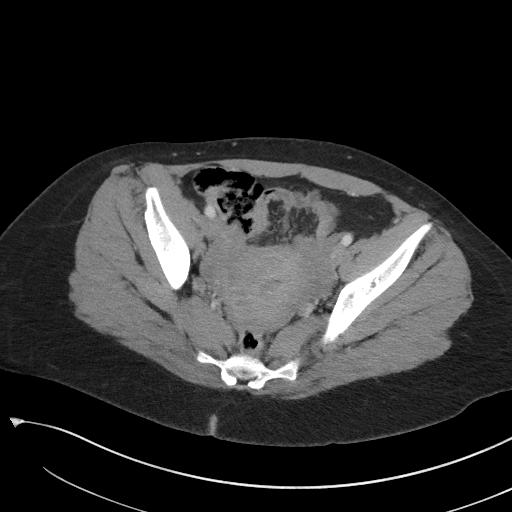
[im 48/65  soft-tissue]
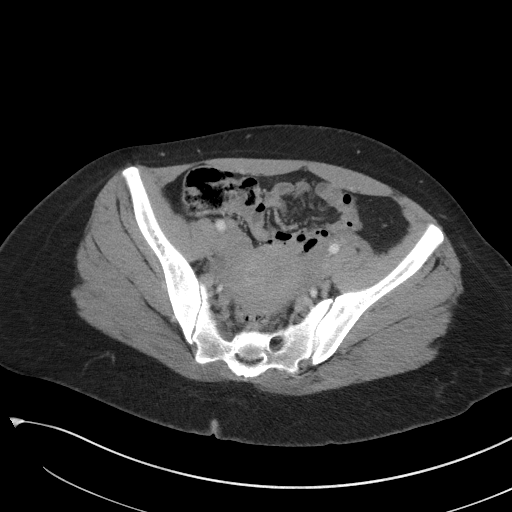
[im 52/65  soft-tissue]
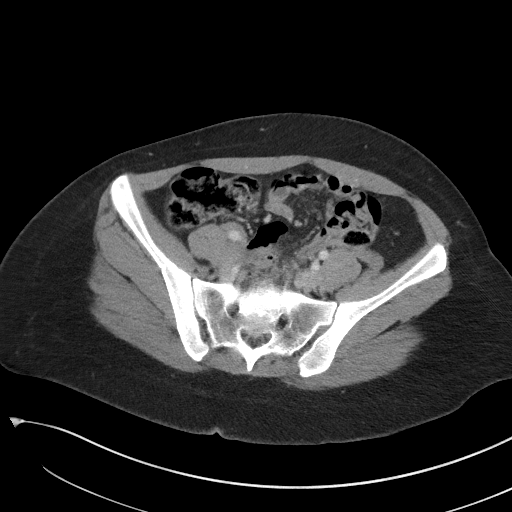
[im 56/65  soft-tissue]
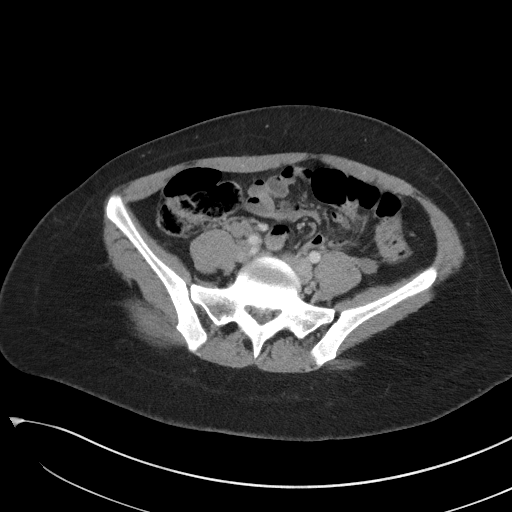
[im 60/65  soft-tissue]
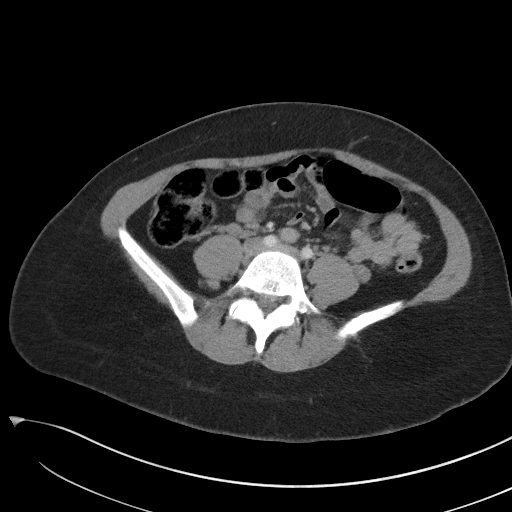

[Series 4: coronal st · coronal · 0.68mm/px · 3 of 101 slices shown]
[im 34/101  soft-tissue]
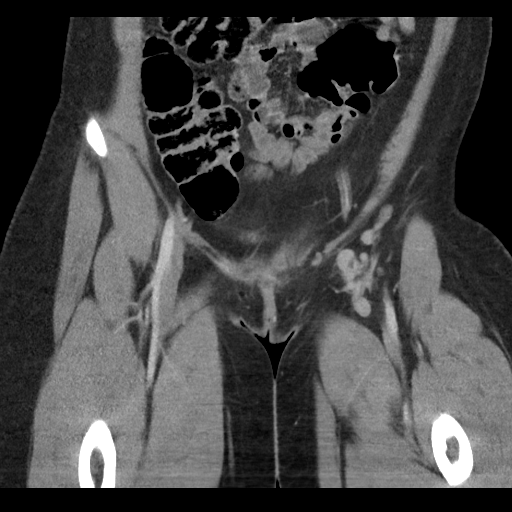
[im 45/101  soft-tissue]
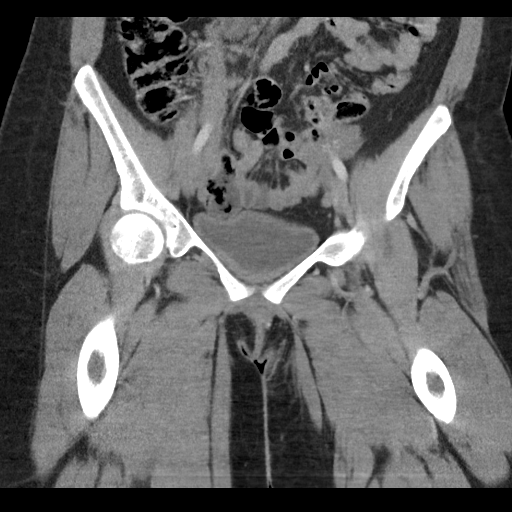
[im 56/101  soft-tissue]
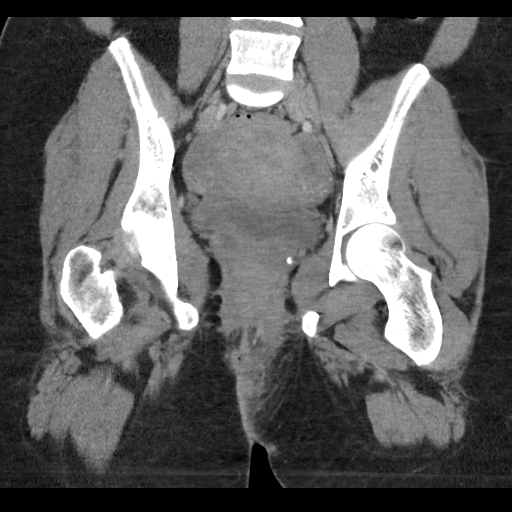

[17 of 46 positions shown; findings below may reference images not displayed]

FINDINGS: Urinary Tract:  No abnormality visualized.

Bowel:  Unremarkable visualized pelvic bowel loops.

Vascular/Lymphatic: No pathologically enlarged lymph nodes. No
significant vascular abnormality seen.

Reproductive: Enlarged uterus is noted consistent with recently
postpartum status. No definite adnexal abnormality is noted.

Other: There are inflammatory changes seen involving the
subcutaneous tissues of the left buttocks region medially most
consistent with cellulitis. However, no definite fluid collection or
abscess is noted.

Musculoskeletal: No suspicious bone lesions identified.
IMPRESSION: 1. Inflammatory changes are seen involving the subcutaneous tissues
of the left buttocks region medially most consistent with
cellulitis. However, no definite fluid collection or abscess is
noted.
2. Enlarged uterus is noted consistent with recently postpartum
status.

## 2022-05-26 ENCOUNTER — Ambulatory Visit
Admission: EM | Admit: 2022-05-26 | Discharge: 2022-05-26 | Disposition: A | Discharge status: Home / Self Care | Payer: Medicaid Other | Attending: Family Medicine | Admitting: Family Medicine

## 2022-05-26 ENCOUNTER — Encounter: Payer: Self-pay | Admitting: Emergency Medicine

## 2022-05-26 DIAGNOSIS — J029 Acute pharyngitis, unspecified: Secondary | ICD-10-CM | POA: Diagnosis present

## 2022-05-26 DIAGNOSIS — Z1152 Encounter for screening for COVID-19: Secondary | ICD-10-CM | POA: Insufficient documentation

## 2022-05-26 LAB — GROUP A STREP BY PCR: Group A Strep by PCR: NOT DETECTED

## 2022-05-26 LAB — RESP PANEL BY RT-PCR (FLU A&B, COVID) ARPGX2
Influenza A by PCR: NEGATIVE
Influenza B by PCR: NEGATIVE
SARS Coronavirus 2 by RT PCR: NEGATIVE

## 2022-05-26 MED ORDER — AMOXICILLIN 875 MG PO TABS: 875.0000 mg | ORAL_TABLET | Freq: Two times a day (BID) | ORAL | 0 refills | Status: AC

## 2022-05-26 MED ORDER — IBUPROFEN 800 MG PO TABS
800.0000 mg | ORAL_TABLET | Freq: Once | ORAL | Status: AC
  Administered 2022-05-26: 800 mg via ORAL

## 2022-05-26 NOTE — ED Triage Notes (Signed)
Patient c/o sore throat, chills and HA that started 2 days ago.  Patient denies fevers.

## 2022-05-26 NOTE — ED Provider Notes (Signed)
[facility]    CSN: [id number] Arrival date & time: 05/26/22  0825      History   Chief Complaint Chief Complaint  Patient presents with   Sore Throat    HPI Leslie Trevino is a 26 y.o. female.   HPI   Leslie Trevino presents for sore throat and headache that started 2 days ago. Endorses chills. Says she has felt hot but denies fever.  She has been taking over-the-counter medications without relief.  She feels like her neck is swollen.  No one else has similar symptoms. She has painful swallowing. She has been having to spit out her sputum because it tastes bad and hurts to swallow.          Past Medical History:  Diagnosis Date   ADD (attention deficit disorder)    History of miscarriage    Molar pregnancy     Patient Active Problem List   Diagnosis Date Noted   Abscess 01/13/2020   Cellulitis, perineum    Leukocytosis    Hypokalemia    Sepsis ([place]) 01/12/2020   Cellulitis of left buttock 01/12/2020   NSVD (normal spontaneous vaginal delivery) 12/21/2019   Preeclampsia in postpartum period 12/21/2019    Past Surgical History:  Procedure Laterality Date   NO PAST SURGERIES      OB History     Gravida  2   Para  2   Term  2   Preterm      AB      Living  2      SAB      IAB      Ectopic      Multiple  0   Live Births  1            Home Medications    Prior to Admission medications   Medication Sig Start Date End Date Taking? Authorizing Provider  amoxicillin (AMOXIL) 875 MG tablet Take 1 tablet (875 mg total) by mouth 2 (two) times daily for 10 days. 05/26/22 06/05/22 Yes Terryn Rosenkranz, Ronnette Juniper, DO  lisdexamfetamine (VYVANSE) 70 MG capsule Take by mouth. 11/09/21  Yes [provider]  buPROPion (WELLBUTRIN XL) 300 MG 24 hr tablet Take by mouth. 12/11/21 12/11/22  [provider]  nitrofurantoin, macrocrystal-monohydrate, (MACROBID) 100 MG capsule Take 1 capsule (100 mg total) by mouth 2 (two) times daily.  02/23/22   Luvenia Redden, PA-C  ferrous sulfate 325 (65 FE) MG tablet Take 325 mg by mouth daily with breakfast.  01/12/20  [provider]  NIFEdipine (ADALAT CC) 60 MG 24 hr tablet Take 1 tablet (60 mg total) by mouth daily. 12/25/19 01/12/20  Minda Meo, CNM  Potassium 99 MG TABS Take by mouth.  09/21/19  [provider]    Family History Family History  Problem Relation Age of Onset   Healthy Mother    Healthy Father     Social History Social History   Tobacco Use   Smoking status: Former    Packs/day: 0.25    Types: Cigarettes   Smokeless tobacco: Never  Vaping Use   Vaping Use: Former  Substance Use Topics   Alcohol use: Yes    Comment: occasional   Drug use: Never     Allergies   Patient has no known allergies.   Review of Systems Review of Systems: negative unless otherwise stated in HPI.      Physical Exam Triage Vital Signs ED Triage Vitals  Enc Vitals Group  BP 05/26/22 0839 119/80     Pulse Rate 05/26/22 0839 99     Resp 05/26/22 0839 14     Temp 05/26/22 0839 99.7 F (37.6 C)     Temp Source 05/26/22 0839 Oral     SpO2 05/26/22 0839 97 %     Weight 05/26/22 0834 178 lb 8 oz (81 kg)     Height 05/26/22 0834 5\' 5"  (1.651 m)     Head Circumference --      Peak Flow --      Pain Score 05/26/22 0834 10     Pain Loc --      Pain Edu? --      Excl. in GC? --    No data found.  Updated Vital Signs BP 119/80 (BP Location: Left Arm)   Pulse 99   Temp 99.7 F (37.6 C) (Oral)   Resp 14   Ht 5\' 5"  (1.651 m)   Wt 81 kg   LMP 05/12/2022 (Approximate)   SpO2 97%   BMI 29.70 kg/m   Visual Acuity Right Eye Distance:   Left Eye Distance:   Bilateral Distance:    Right Eye Near:   Left Eye Near:    Bilateral Near:     Physical Exam GEN:     alert, non-toxic appearing female in no distress    HENT:  mucus membranes moist, oropharyngeal without lesions or exudate, +3 tonsillar hypertrophy, moderate oropharyngeal  erythema, clear nasal discharge, bilateral TM normal EYES:   pupils equal and reactive, EOMi, no scleral injection NECK:  normal ROM, bilateral cervical lymphadenopathy, no meningismus   RESP:  no increased work of breathing, clear to auscultation bilaterally CVS:   regular rate and rhythm Skin:   warm and dry, no rash on visible skin, normal skin turgor    UC Treatments / Results  Labs (all labs ordered are listed, but only abnormal results are displayed) Labs Reviewed  GROUP A STREP BY PCR  RESP PANEL BY RT-PCR (FLU A&B, COVID) [id number]    EKG   Radiology No results found.  Procedures Procedures (including critical care time)  Medications Ordered in UC Medications  ibuprofen (ADVIL) tablet 800 mg (800 mg Oral Given 05/26/22 0929)    Initial Impression / Assessment and Plan / UC Course  I have reviewed the triage vital signs and the nursing notes.  Pertinent labs & imaging results that were available during my care of the patient were reviewed by me and considered in my medical decision making (see chart for details).       Pt is a 26 y.o. female who presents for 2 days of sore throat with headache. Leslie Trevino has an elevated temperature here, 99.7 F, with recent use of antipyretics.  Satting well on room air. Overall pt is ill- appearing, well hydrated, without respiratory distress. Pulmonary exam  is unremarkable.  COVID  and influenza testing obtained and was negative. Strep PCR negative. I believe she may have bacterial tonsillitis. Treat with amoxicillin for 10 days.  Typical duration of symptoms discussed.  Return and ED precautions given and patient voiced understanding.  Discussed MDM, treatment plan and plan for follow-up with patient who agrees with plan.     Final Clinical Impressions(s) / UC Diagnoses   Final diagnoses:  Pharyngitis, unspecified etiology     Discharge Instructions      Stop by the pharmacy to pick up your antibiotics.  Your COVID,  influenza and strep tests are negative. You throat  looks like you have an infection in your tonsils.    Go to ED for red flag symptoms, including; fevers you cannot reduce with Tylenol/Motrin, severe headaches, vision changes, numbness/weakness in part of the body, lethargy, confusion, intractable vomiting, severe dehydration, chest pain, breathing difficulty, severe persistent abdominal or pelvic pain, signs of severe infection (increased redness, swelling of an area), feeling faint or passing out, dizziness, etc. You should especially go to the ED for sudden acute worsening of condition if you do not elect to go at this time.       ED Prescriptions     Medication Sig Dispense Auth. Provider   amoxicillin (AMOXIL) 875 MG tablet Take 1 tablet (875 mg total) by mouth 2 (two) times daily for 10 days. 20 tablet Katha Cabal, DO      PDMP not reviewed this encounter.   Katha Cabal, DO 05/26/22 1033

## 2022-05-26 NOTE — Discharge Instructions (Addendum)
Stop by the pharmacy to pick up your antibiotics.  Your COVID, influenza and strep tests are negative. You throat looks like you have an infection in your tonsils.    Go to ED for red flag symptoms, including; fevers you cannot reduce with Tylenol/Motrin, severe headaches, vision changes, numbness/weakness in part of the body, lethargy, confusion, intractable vomiting, severe dehydration, chest pain, breathing difficulty, severe persistent abdominal or pelvic pain, signs of severe infection (increased redness, swelling of an area), feeling faint or passing out, dizziness, etc. You should especially go to the ED for sudden acute worsening of condition if you do not elect to go at this time.

## 2022-12-07 ENCOUNTER — Ambulatory Visit
Admission: EM | Admit: 2022-12-07 | Discharge: 2022-12-07 | Disposition: A | Discharge status: Home / Self Care | Payer: Medicaid Other | Attending: Family Medicine | Admitting: Family Medicine

## 2022-12-07 DIAGNOSIS — H65191 Other acute nonsuppurative otitis media, right ear: Secondary | ICD-10-CM

## 2022-12-07 MED ORDER — CIPROFLOXACIN-DEXAMETHASONE 0.3-0.1 % OT SUSP: 4.0000 [drp] | Freq: Two times a day (BID) | OTIC | 0 refills | Status: DC
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
## 2022-12-07 NOTE — Discharge Instructions (Addendum)
Stop by the pharmacy to pick up your prescriptions.  Follow up with your primary care provider as needed.  

## 2023-09-03 ENCOUNTER — Ambulatory Visit
Admission: EM | Admit: 2023-09-03 | Discharge: 2023-09-03 | Disposition: A | Discharge status: Home / Self Care | Payer: Medicaid Other | Attending: Emergency Medicine | Admitting: Emergency Medicine

## 2023-09-03 ENCOUNTER — Encounter: Payer: Self-pay | Admitting: Emergency Medicine

## 2023-09-03 DIAGNOSIS — Z113 Encounter for screening for infections with a predominantly sexual mode of transmission: Secondary | ICD-10-CM | POA: Diagnosis present

## 2023-09-03 DIAGNOSIS — N390 Urinary tract infection, site not specified: Secondary | ICD-10-CM | POA: Insufficient documentation

## 2023-09-03 DIAGNOSIS — K029 Dental caries, unspecified: Secondary | ICD-10-CM | POA: Insufficient documentation

## 2023-09-03 DIAGNOSIS — N760 Acute vaginitis: Secondary | ICD-10-CM | POA: Diagnosis present

## 2023-09-03 DIAGNOSIS — B9689 Other specified bacterial agents as the cause of diseases classified elsewhere: Secondary | ICD-10-CM | POA: Diagnosis present

## 2023-09-03 DIAGNOSIS — K047 Periapical abscess without sinus: Secondary | ICD-10-CM | POA: Insufficient documentation

## 2023-09-03 LAB — URINALYSIS, W/ REFLEX TO CULTURE (INFECTION SUSPECTED)
Bilirubin Urine: NEGATIVE
Glucose, UA: NEGATIVE mg/dL
Hgb urine dipstick: NEGATIVE
Ketones, ur: NEGATIVE mg/dL
Leukocytes,Ua: NEGATIVE
Nitrite: POSITIVE — AB
Protein, ur: NEGATIVE mg/dL
Specific Gravity, Urine: 1.025 (ref 1.005–1.030)
pH: 6.5 (ref 5.0–8.0)

## 2023-09-03 LAB — WET PREP, GENITAL
Sperm: NONE SEEN
Trich, Wet Prep: NONE SEEN
WBC, Wet Prep HPF POC: >10 — AB (ref <10)
Yeast Wet Prep HPF POC: NONE SEEN

## 2023-09-03 LAB — HIV ANTIBODY (ROUTINE TESTING W REFLEX): HIV Screen 4th Generation wRfx: NONREACTIVE

## 2023-09-03 LAB — PREGNANCY, URINE: Preg Test, Ur: NEGATIVE

## 2023-09-03 MED ORDER — METRONIDAZOLE 500 MG PO TABS: 500.0000 mg | ORAL_TABLET | Freq: Two times a day (BID) | ORAL | 0 refills | Status: AC

## 2023-09-03 MED ORDER — IBUPROFEN 600 MG PO TABS: 600.0000 mg | ORAL_TABLET | Freq: Four times a day (QID) | ORAL | 0 refills | Status: AC | PRN

## 2023-09-03 MED ORDER — CHLORHEXIDINE GLUCONATE 0.12 % MT SOLN: OROMUCOSAL | 0 refills | Status: DC

## 2023-09-03 MED ORDER — AMOXICILLIN-POT CLAVULANATE 875-125 MG PO TABS: 1.0000 | ORAL_TABLET | Freq: Two times a day (BID) | ORAL | 0 refills | Status: DC

## 2023-09-03 NOTE — ED Provider Notes (Signed)
HPI  SUBJECTIVE:  Leslie Trevino is a 28 y.o. female who presents with 2 issues.    First, she reports vaginal odor, clear discharge and vaginal itching starting 3 days ago.  She reports cloudy and odorous urine.  No genital rash, ulcers, fevers, dysuria, urgency, frequency, hematuria, abdominal, back, pelvic pain.  She is sexually active with a single female partner who is asymptomatic.  However, she just discovered that he has other sexual partners.  STDs are a concern today.  No recent antibiotics.  He has not tried anything for symptoms.  No aggravating or alleviating factors.  She has a past medical history of gonorrhea, BV and yeast.  No history of other STDs, diabetes.  LMP: She is unsure if she could be pregnant.  She missed her Depo shot yesterday.  PCP: [facility].  Second, patient reports throbbing, constant right lower molar pain, sensitivity to temperature and air the past 2 days.  This is a known cracked tooth, but the pain is new.  No repeat trauma, facial swelling, trismus, swelling underneath her jaw, sore throat, fevers.  She took Tylenol 1500 mg without improvement in her symptoms.  She has also tried salt water rinses.  No alleviating factors.  Symptoms are worse with exposure to air with cold drinks.  Dentist: [facility] in [place].   Past Medical History:  Diagnosis Date   ADD (attention deficit disorder)    History of miscarriage    Molar pregnancy     Past Surgical History:  Procedure Laterality Date   NO PAST SURGERIES      Family History  Problem Relation Age of Onset   Healthy Mother    Healthy Father     Social History   Tobacco Use   Smoking status: Some Days    Current packs/day: 0.25    Types: Cigarettes   Smokeless tobacco: Never  Vaping Use   Vaping status: Every Day  Substance Use Topics   Alcohol use: Yes    Comment: occasional   Drug use: Never    No current facility-administered medications for this  encounter.  Current Outpatient Medications:    amoxicillin-clavulanate (AUGMENTIN) 875-125 MG tablet, Take 1 tablet by mouth every 12 (twelve) hours., Disp: 14 tablet, Rfl: 0   chlorhexidine (PERIDEX) 0.12 % solution, 15 mL swish and spit bid, Disp: 480 mL, Rfl: 0   ibuprofen (ADVIL) 600 MG tablet, Take 1 tablet (600 mg total) by mouth every 6 (six) hours as needed., Disp: 30 tablet, Rfl: 0   lisdexamfetamine (VYVANSE) 70 MG capsule, Take by mouth., Disp: , Rfl:    metroNIDAZOLE (FLAGYL) 500 MG tablet, Take 1 tablet (500 mg total) by mouth 2 (two) times daily for 7 days., Disp: 14 tablet, Rfl: 0   venlafaxine XR (EFFEXOR-XR) 150 MG 24 hr capsule, Take by mouth., Disp: , Rfl:    buPROPion (WELLBUTRIN XL) 300 MG 24 hr tablet, Take by mouth., Disp: , Rfl:    medroxyPROGESTERone (DEPO-PROVERA) 150 MG/ML injection, Inject into the muscle., Disp: , Rfl:   Allergies  Allergen Reactions   Sertraline Other (See Comments)    Per pt: increased suicidal ideation     ROS  As noted in HPI.   Physical Exam  BP 115/77 (BP Location: Left Arm)   Pulse 77   Temp 98.2 F (36.8 C) (Oral)   Resp 16   SpO2 100%   Constitutional: Well developed, well nourished, no acute distress Eyes:  EOMI, conjunctiva normal bilaterally HENT: Normocephalic,  atraumatic,mucus membranes moist.  Tooth number 30, 1st molar extensively decayed, tender to palpation, with purulent drainage.  Positive surrounding gingival swelling, tenderness.  No trismus, swelling underneath the jaw. Neck: No cervical lymphadenopathy Respiratory: Normal inspiratory effort Cardiovascular: Normal rate GI: nondistended soft, nontender. No suprapubic tenderness  back: No CVA tenderness GU: Deferred. skin: No rash, skin intact Musculoskeletal: no deformities Neurologic: Alert & oriented x 3, no focal neuro deficits Psychiatric: Speech and behavior appropriate   ED Course   Medications - No data to display  Orders Placed This  Encounter  Procedures   Wet prep, genital    Standing Status:   Standing    Number of Occurrences:   1   Urine Culture    Standing Status:   Standing    Number of Occurrences:   1    Indication:   Dysuria   RPR    Standing Status:   Standing    Number of Occurrences:   1   HIV Antibody (routine testing w rflx)    Standing Status:   Standing    Number of Occurrences:   1   Urinalysis, w/ Reflex to Culture (Infection Suspected) -Urine, Clean Catch    Standing Status:   Standing    Number of Occurrences:   1    Specimen Source:   Urine, Clean Catch [76]   Pregnancy, urine    Standing Status:   Standing    Number of Occurrences:   1    Results for orders placed or performed during the hospital encounter of 09/03/23 (from the past 24 hours)  Wet prep, genital     Status: Abnormal   Collection Time: 09/03/23  6:24 PM  Result Value Ref Range   Yeast Wet Prep HPF POC NONE SEEN NONE SEEN   Trich, Wet Prep NONE SEEN NONE SEEN   Clue Cells Wet Prep HPF POC PRESENT (A) NONE SEEN   WBC, Wet Prep HPF POC >10 (A) <10   Sperm NONE SEEN   Urinalysis, w/ Reflex to Culture (Infection Suspected) -Urine, Clean Catch     Status: Abnormal   Collection Time: 09/03/23  7:00 PM  Result Value Ref Range   Specimen Source URINE, CLEAN CATCH    Color, Urine STRAW (A) YELLOW   APPearance HAZY (A) CLEAR   Specific Gravity, Urine 1.025 1.005 - 1.030   pH 6.5 5.0 - 8.0   Glucose, UA NEGATIVE NEGATIVE mg/dL   Hgb urine dipstick NEGATIVE NEGATIVE   Bilirubin Urine NEGATIVE NEGATIVE   Ketones, ur NEGATIVE NEGATIVE mg/dL   Protein, ur NEGATIVE NEGATIVE mg/dL   Nitrite POSITIVE (A) NEGATIVE   Leukocytes,Ua NEGATIVE NEGATIVE   Squamous Epithelial / HPF 0-5 0 - 5 /HPF   WBC, UA 6-10 0 - 5 WBC/hpf   RBC / HPF 0-5 0 - 5 RBC/hpf   Bacteria, UA MANY (A) NONE SEEN   WBC Clumps PRESENT    Mucus PRESENT   Pregnancy, urine     Status: None   Collection Time: 09/03/23  7:00 PM  Result Value Ref Range   Preg  Test, Ur NEGATIVE NEGATIVE   No results found.  ED Clinical Impression  1. Dental infection   2. Dental decay   3. Screening for STDs (sexually transmitted diseases)   4. Bacterial vaginosis   5. Urinary tract infection without hematuria, site unspecified    ED Assessment/Plan   1.  STD screening/vaginal discharge.  Wet prep, gonorrhea/chlamydia, HIV, syphilis sent.  Advised patient  that we do not do testing for herpes unless she has active lesions.  Patient denies any lesions.  Wet prep positive for BV. Will send home with flagyl.  Urine pregnancy negative.  She also appears to have a UTI with positive nitrite, many bacteria and pyuria, but she denies any urinary symptoms other than cloudy/odorous urine.  Will send this off for culture.  Augmentin should take care of her urinary tract infection.  Advised pt to refrain from sexual contact until she knows lab results, symptoms resolve, and partner(s) are treated if necessary. Pt provided working phone number. Follow-up with PCP as needed. Discussed labs, MDM, plan and followup with patient. Pt agrees with plan.   2.  Dental decay/dental infection.  Home with Augmentin, Tylenol/ibuprofen, Peridex, warm salt water rinses.  Patient declined dental block.  Follow-up with her dentist ASAP.    Meds ordered this encounter  Medications   chlorhexidine (PERIDEX) 0.12 % solution    Sig: 15 mL swish and spit bid    Dispense:  480 mL    Refill:  0   ibuprofen (ADVIL) 600 MG tablet    Sig: Take 1 tablet (600 mg total) by mouth every 6 (six) hours as needed.    Dispense:  30 tablet    Refill:  0   amoxicillin-clavulanate (AUGMENTIN) 875-125 MG tablet    Sig: Take 1 tablet by mouth every 12 (twelve) hours.    Dispense:  14 tablet    Refill:  0   metroNIDAZOLE (FLAGYL) 500 MG tablet    Sig: Take 1 tablet (500 mg total) by mouth 2 (two) times daily for 7 days.    Dispense:  14 tablet    Refill:  0    *This clinic note was created using  Scientist, clinical (histocompatibility and immunogenetics). Therefore, there may be occasional mistakes despite careful proofreading.  ?     Domenick Gong, MD 09/03/23 2005

## 2023-09-03 NOTE — Discharge Instructions (Addendum)
Urine pregnancy is negative.  You appear to have a urinary tract infection, the Augmentin should take care of this.  We have sent your urine off for culture to make sure that we have you on the correct antibiotic.  You have bacterial vaginosis.  You do not have yeast or trichomonas.  Your gonorrhea, chlamydia, HIV and syphilis are pending.  Take 600 mg of ibuprofen with 1000 mg of Tylenol 3-4 times a day as needed.  Peridex for the infection.  This after you eat.  Warm salt water rinses as often as you want.  Please follow-up with your dentist ASAP.  Take the medication as written. Give [place] a working phone number so that we can contact you if needed. Refrain from sexual contact until all of your labs have come back, symptoms have resolved, and your partner(s) are treated if necessary.   Go to www.goodrx.com  or www.costplusdrugs.com to look up your medications. This will give you a list of where you can find your prescriptions at the most affordable prices. Or ask the pharmacist what the cash price is, or if they have any other discount programs available to help make your medication more affordable. This can be less expensive than what you would pay with insurance.

## 2023-09-03 NOTE — ED Triage Notes (Signed)
Pt presents with vaginal itching and odor x 3 days. Pt also wants to be tested for STDs. She also has right side tooth pain.

## 2023-09-04 LAB — RPR: RPR Ser Ql: NONREACTIVE

## 2023-09-04 LAB — CERVICOVAGINAL ANCILLARY ONLY
Chlamydia: NEGATIVE
Comment: NEGATIVE
Comment: NORMAL
Neisseria Gonorrhea: NEGATIVE

## 2023-09-05 ENCOUNTER — Telehealth: Payer: Self-pay

## 2023-09-05 LAB — URINE CULTURE: Culture: >=100000 — AB

## 2023-09-05 MED ORDER — CHLORHEXIDINE GLUCONATE 0.12 % MT SOLN: OROMUCOSAL | 0 refills | Status: AC

## 2023-09-05 MED ORDER — NITROFURANTOIN MONOHYD MACRO 100 MG PO CAPS
100.0000 mg | ORAL_CAPSULE | Freq: Two times a day (BID) | ORAL | 0 refills | Status: DC
Start: 2023-09-05 — End: 2023-09-05

## 2023-09-05 MED ORDER — NITROFURANTOIN MONOHYD MACRO 100 MG PO CAPS: 100.0000 mg | ORAL_CAPSULE | Freq: Two times a day (BID) | ORAL | 0 refills | Status: AC

## 2023-09-05 MED ORDER — AMOXICILLIN-POT CLAVULANATE 875-125 MG PO TABS: 1.0000 | ORAL_TABLET | Freq: Two times a day (BID) | ORAL | 0 refills | Status: DC

## 2023-09-05 NOTE — Addendum Note (Signed)
Addended by: Warren Danes on: 09/05/2023 02:05 PM   Modules accepted: Orders

## 2023-09-05 NOTE — Telephone Encounter (Signed)
Attempted to reach patient x1. LVM.  Rx sent to pharmacy on file.

## 2023-09-05 NOTE — Telephone Encounter (Signed)
-----   Message from [place] Hermanns sent at 09/05/2023 12:52 PM EST ----- I would stop the augmentin and switch to macrobid ----- Message ----- From: Warren Danes, RN Sent: 09/05/2023  11:32 AM EST To: Domenick Gong, MD; Annabell Howells Hermanns, PA-C  Pt went home on Augmentin. Any treatment updates needed for urine culture? Thank you

## 2023-09-05 NOTE — Telephone Encounter (Signed)
Pt requested pharmacy change. Was only able to pick up metronidazole and ibuprofen.  Macrobid, Augmentin, and chlorhexidine sent to updated pharmacy.

## 2024-08-04 ENCOUNTER — Ambulatory Visit
Admission: EM | Admit: 2024-08-04 | Discharge: 2024-08-04 | Disposition: A | Discharge status: Home / Self Care | Attending: Physician Assistant | Admitting: Physician Assistant

## 2024-08-04 DIAGNOSIS — K029 Dental caries, unspecified: Secondary | ICD-10-CM

## 2024-08-04 DIAGNOSIS — K047 Periapical abscess without sinus: Secondary | ICD-10-CM | POA: Diagnosis not present

## 2024-08-04 MED ORDER — AMOXICILLIN-POT CLAVULANATE 875-125 MG PO TABS: 1.0000 | ORAL_TABLET | Freq: Two times a day (BID) | ORAL | 0 refills | Status: AC

## 2024-08-04 NOTE — ED Provider Notes (Signed)
 " [facility]    CSN: [id number] Arrival date & time: 08/04/24  1554      History   Chief Complaint Chief Complaint  Patient presents with   Dental Pain    HPI Leslie Trevino is a 29 y.o. female presenting for pain of the tooth of the right lower side for a while.  She states when she was eating a burrito the tooth broke off more and she is having discomfort.  She is concerned for possible infection.  She does go to [facility] but admits it has been a while since she was seen.  She has not been having any facial swelling or fever.  Occasionally takes OTC meds as needed for pain.  No other complaints.  HPI  Past Medical History:  Diagnosis Date   ADD (attention deficit disorder)    History of miscarriage    Molar pregnancy     Patient Active Problem List   Diagnosis Date Noted   Major depressive disorder, recurrent, in remission 09/06/2020   Abscess 01/13/2020   Cellulitis, perineum    Leukocytosis    Hypokalemia    Sepsis (HCC) 01/12/2020   Cellulitis of left buttock 01/12/2020   NSVD (normal spontaneous vaginal delivery) 12/21/2019   Preeclampsia in postpartum period 12/21/2019   Hyperthyroidism 10/14/2016   Adult ADHD 02/09/2011   Anxiety 02/09/2011    Past Surgical History:  Procedure Laterality Date   NO PAST SURGERIES      OB History     Gravida  2   Para  2   Term  2   Preterm      AB      Living  2      SAB      IAB      Ectopic      Multiple  0   Live Births  1            Home Medications    Prior to Admission medications  Medication Sig Start Date End Date Taking? Authorizing Provider  amoxicillin -clavulanate (AUGMENTIN ) 875-125 MG tablet Take 1 tablet by mouth every 12 (twelve) hours for 7 days. 08/04/24 08/11/24 Yes Arvis Jolan NOVAK, PA-C  buPROPion (WELLBUTRIN XL) 300 MG 24 hr tablet Take by mouth. 12/11/21 12/11/22  [provider]  chlorhexidine  (PERIDEX ) 0.12 % solution 15 mL swish and spit  bid 09/05/23   Mortenson, Ashley, MD  ibuprofen  (ADVIL ) 600 MG tablet Take 1 tablet (600 mg total) by mouth every 6 (six) hours as needed. 09/03/23   Mortenson, Ashley, MD  lisdexamfetamine (VYVANSE) 70 MG capsule Take by mouth. 11/09/21   [provider]  medroxyPROGESTERone (DEPO-PROVERA) 150 MG/ML injection Inject into the muscle. 05/29/22 05/24/23  [provider]  nitrofurantoin , macrocrystal-monohydrate, (MACROBID ) 100 MG capsule Take 1 capsule (100 mg total) by mouth 2 (two) times daily. 09/05/23   Mortenson, Ashley, MD  venlafaxine XR (EFFEXOR-XR) 150 MG 24 hr capsule Take by mouth. 03/20/23 03/19/24  [provider]  ferrous sulfate  325 (65 FE) MG tablet Take 325 mg by mouth daily with breakfast.  01/12/20  [provider]  NIFEdipine  (ADALAT  CC) 60 MG 24 hr tablet Take 1 tablet (60 mg total) by mouth daily. 12/25/19 01/12/20  Vernel Therisa HERO, CNM  Potassium 99 MG TABS Take by mouth.  09/21/19  [provider]    Family History Family History  Problem Relation Age of Onset   Healthy Mother    Healthy Father  Social History Social History[1]   Allergies   Sertraline   Review of Systems Review of Systems  Constitutional:  Negative for fatigue and fever.  HENT:  Positive for dental problem. Negative for facial swelling and trouble swallowing.   Neurological:  Negative for dizziness, weakness and headaches.     Physical Exam Triage Vital Signs ED Triage Vitals  Encounter Vitals Group     BP      Girls Systolic BP Percentile      Girls Diastolic BP Percentile      Boys Systolic BP Percentile      Boys Diastolic BP Percentile      Pulse      Resp      Temp      Temp src      SpO2      Weight      Height      Head Circumference      Peak Flow      Pain Score      Pain Loc      Pain Education      Exclude from Growth Chart    No data found.  Updated Vital Signs BP 105/68 (BP Location: Left Arm)   Pulse 75   Temp 98.4 F  (36.9 C) (Oral)   Resp 18   Wt 184 lb 3.2 oz (83.6 kg)   SpO2 97%   BMI 30.65 kg/m      Physical Exam Vitals and nursing note reviewed.  Constitutional:      General: She is not in acute distress.    Appearance: Normal appearance. She is not ill-appearing or toxic-appearing.  HENT:     Head: Normocephalic and atraumatic.     Mouth/Throat:     Mouth: Mucous membranes are moist.     Pharynx: Oropharynx is clear.     Comments: Significant dental decay and broken tooth of the right lower side.  See image included in chart.  Area is tender to palpation. Eyes:     General: No scleral icterus.       Right eye: No discharge.        Left eye: No discharge.     Conjunctiva/sclera: Conjunctivae normal.  Cardiovascular:     Rate and Rhythm: Normal rate and regular rhythm.  Pulmonary:     Effort: Pulmonary effort is normal. No respiratory distress.  Musculoskeletal:     Cervical back: Neck supple.  Skin:    General: Skin is dry.  Neurological:     General: No focal deficit present.     Mental Status: She is alert. Mental status is at baseline.     Motor: No weakness.     Gait: Gait normal.  Psychiatric:        Mood and Affect: Mood normal.        Behavior: Behavior normal.         UC Treatments / Results  Labs (all labs ordered are listed, but only abnormal results are displayed) Labs Reviewed - No data to display  EKG   Radiology No results found.  Procedures Procedures (including critical care time)  Medications Ordered in UC Medications - No data to display  Initial Impression / Assessment and Plan / UC Course  I have reviewed the triage vital signs and the nursing notes.  Pertinent labs & imaging results that were available during my care of the patient were reviewed by me and considered in my medical decision making (see chart for details).  29 year old female presents for dental pain of the tooth of the right lower side for a while.  Tooth broke today  when eating a burrito.  She has not seen dentist in a while but usually goes to [facility].  She plans to make a follow-up appointment.  On exam today she has dental caries and a broken tooth.  Will prescribe Augmentin .  Advised over-the-counter Tylenol  Motrin  as needed for pain relief.  Discussed practicing good dental hygiene and the importance of following up with her dentist.   Final Clinical Impressions(s) / UC Diagnoses   Final diagnoses:  Infected dental caries   Discharge Instructions   None    ED Prescriptions     Medication Sig Dispense Auth. Provider   amoxicillin -clavulanate (AUGMENTIN ) 875-125 MG tablet Take 1 tablet by mouth every 12 (twelve) hours for 7 days. 14 tablet Katie Moch B, PA-C      PDMP not reviewed this encounter.     [1]  Social History Tobacco Use   Smoking status: Some Days    Current packs/day: 0.25    Types: Cigarettes   Smokeless tobacco: Never  Vaping Use   Vaping status: Every Day  Substance Use Topics   Alcohol use: Yes    Comment: occasional   Drug use: Never     Arvis Jolan NOVAK, PA-C 08/04/24 1800  "

## 2024-08-04 NOTE — ED Triage Notes (Signed)
 Pt c/o dental pain that started today. States was eating a burrito & caused her lower tooth to crack more. Has tried OTC meds w/o relief.
